# Patient Record
Sex: Male | Born: 1990 | Race: White | Hispanic: No | Marital: Single
Health system: Midwestern US, Community
[De-identification: ages and names within clinical notes are randomized; demographics above are authoritative.]

## PROBLEM LIST (undated history)

## (undated) DIAGNOSIS — K648 Other hemorrhoids: Secondary | ICD-10-CM

## (undated) DIAGNOSIS — G629 Polyneuropathy, unspecified: Secondary | ICD-10-CM

## (undated) HISTORY — DX: Other hemorrhoids: K64.8

---
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
## 2012-05-01 ENCOUNTER — Other Ambulatory Visit: Payer: Self-pay | Admitting: Family Medicine

## 2012-06-09 ENCOUNTER — Encounter (HOSPITAL_COMMUNITY): Payer: Self-pay | Admitting: Emergency Medicine

## 2012-06-09 ENCOUNTER — Emergency Department (HOSPITAL_COMMUNITY)
Admission: EM | Admit: 2012-06-09 | Discharge: 2012-06-09 | Disposition: A | Discharge status: Home / Self Care | Payer: 59 | Source: Home / Self Care | Attending: Family Medicine | Admitting: Family Medicine

## 2012-06-09 ENCOUNTER — Emergency Department (INDEPENDENT_AMBULATORY_CARE_PROVIDER_SITE_OTHER): Payer: 59

## 2012-06-09 DIAGNOSIS — S60221A Contusion of right hand, initial encounter: Secondary | ICD-10-CM

## 2012-06-09 DIAGNOSIS — S60229A Contusion of unspecified hand, initial encounter: Secondary | ICD-10-CM

## 2012-06-09 MED ORDER — TRAMADOL HCL 50 MG PO TABS: 50.0000 mg | ORAL_TABLET | Freq: Four times a day (QID) | ORAL | Status: AC | PRN

## 2012-06-09 MED ORDER — IBUPROFEN 600 MG PO TABS: 600.0000 mg | ORAL_TABLET | Freq: Three times a day (TID) | ORAL | Status: AC

## 2012-06-09 NOTE — ED Notes (Signed)
Pt "punched the ground" two days go and sustained an injury to his right hand, wrist, and knuckles. Pt states the pain has gotten worse. Ibuprofen helps, has not tried ice or heat. Pt has movement in wrist and hand, but it is somewhat limited and very sore.

## 2012-06-09 NOTE — ED Provider Notes (Signed)
History     CSN: [id number]  Arrival date & time 06/09/12  4098   First MD Initiated Contact with Patient 06/09/12 1839      Chief Complaint  Patient presents with  . Wrist Pain    (Consider location/radiation/quality/duration/timing/severity/associated sxs/prior treatment) HPI Comments: 21 y/o male otherwise healthy here c/o right hand injury sustained 2 days ago while he punched the ground. Noticed pain and swelling in hand dorsally and in palm after his injury. Also reports diffused pain in wrist and hand worse with movement and with hand grip. Swelling and bruising no improving and pain is worse as per patient report. Taking Ibuprofen for his symptoms.    History reviewed. No pertinent past medical history.  History reviewed. No pertinent past surgical history.  History reviewed. No pertinent family history.  History  Substance Use Topics  . Smoking status: Not on file  . Smokeless tobacco: Not on file  . Alcohol Use: Not on file      Review of Systems  Musculoskeletal:       As per HPI  All other systems reviewed and are negative.    Allergies  Review of patient's allergies indicates no known allergies.  Home Medications   Current Outpatient Rx  Name Route Sig Dispense Refill  . BENZOYL PEROXIDE-ERYTHROMYCIN 5-3 % EX GEL  APPLY TO AFFECTED AREA 2 TIMES DAILY 23 g 11  . IBUPROFEN 600 MG PO TABS Oral Take 1 tablet (600 mg total) by mouth 3 (three) times daily. Take with food. 20 tablet 0  . TRAMADOL HCL 50 MG PO TABS Oral Take 1 tablet (50 mg total) by mouth every 6 (six) hours as needed for pain. 20 tablet 0    BP 125/60  Pulse 66  Temp 98.7 F (37.1 C) (Oral)  Resp 14  SpO2 100%  Physical Exam  Nursing note and vitals reviewed. Constitutional: He is oriented to person, place, and time. He appears well-developed and well-nourished. No distress.  HENT:  Head: Normocephalic and atraumatic.  Cardiovascular: Normal heart sounds.   Pulmonary/Chest:  Breath sounds normal.  Musculoskeletal:       Right hand: mild to moderate more dorsal than central palmar swelling; there is associated hematoma more evident in central palm but also noticed in dorsum of hand. No skin erythema, induration, abrasions or lacerations. Diffusely tenderness to palpation over metacarpal bones  on palmar side more over 3rd, 4th and 5th metacarpal, no malalignment or deformities on palpation. Patient able to make a fist with reported discomfort. Able to adduct, abduct fingers and oppose thumb to other digits with mild discomfort.  Carpal area diffusely tender to palpation with no focal tenderness erythema or significant swelling. Limited wrist flexion, extension and lateralization due to pain. Superficial sensation appears intact. Radial and ulnar pulses patent. No distal cyanosis.  Ulnar and radial bones appear intact. With no focal tenderness, swelling or bruising.    Neurological: He is alert and oriented to person, place, and time.    ED Course  Procedures (including critical care time)  Labs Reviewed - No data to display Dg Hand Complete Right  06/09/2012  *RADIOLOGY REPORT*  Clinical Data: Punching injury.  Injury 2 days ago.  Continues to have pain.  Pain in the fifth metacarpal phalangeal joint. Bruising on the palm of the hand.  The patient reports wrist pain.  RIGHT HAND - COMPLETE 3+ VIEW  Comparison: None.  Findings: There is no evidence for acute fracture or dislocation. No soft tissue foreign  body or gas identified.  IMPRESSION: Negative exam.  Original Report Authenticated By: Patterson Hammersmith, M.D.     1. Contusion of hand, right       MDM  No obvious boxers fractures on Xray's. Impress wrist sprain with central palm contusion as a result of hard punch injury. Will probably benefit from immobilization, ICE, elevation and avoid re injury. Prescribed Ibuprofen and tramadol. Asked to follow up with hand specialist as outpatient if no improvement  or worsening symptoms.         Sharin Grave, MD 06/09/12 2257

## 2012-06-10 ENCOUNTER — Telehealth (HOSPITAL_COMMUNITY): Payer: Self-pay | Admitting: *Deleted

## 2012-06-10 NOTE — ED Notes (Signed)
Dr. Tressia Danas asked me to call pt. to come back for a R volar hand splint by the ortho tech. Tell pt. to f/u with Dr. Merlyn Lot.  Discussed with Glean Hess and she said pt. does not have to check back i- just add note to his previous chart. Front desk staff notified not to check pt. in and call me when he arrives. I called pt. and explained this to him.  He states he just wanted to make sure it was not fractured and does not see the need to f/u. I explained that Dr. Alfonse Ras said he has a significant hand contusion and recommends that he follow-up. He said he would come today for the splint. Vassie Moselle 06/10/2012\

## 2012-06-11 ENCOUNTER — Telehealth (HOSPITAL_COMMUNITY): Payer: Self-pay | Admitting: *Deleted

## 2012-06-11 NOTE — ED Notes (Signed)
I called pt. back and he said he came yesterday @ 1500 but was told he had to check back in. He did not want to have to wait 1 1/2  hrs.  I told him I had left a message with Adam at the front desk that he did not have to check in. I told him Dr. Tressia Danas wanted him to have the splint and also wanted him to have a sling, school note if needed and f/u with the hand specialist. He said he doesn't feel like it is necessary. States he does not think it is that bad. He said he does not need the school note because he is not in school now.  I asked him if he was going to follow up with Dr. Merlyn Lot and he said no he did not think it was necessary. I told him I would document this on his chart. Vassie Moselle 06/11/2012

## 2013-10-10 IMAGING — CR DG HAND COMPLETE 3+V*R*
3 series · 3 of 3 positions shown · non-contrast
Comparison: None.

CLINICAL DATA: Punching injury.  Injury 2 days ago.  Continues to
have pain.  Pain in the fifth metacarpal phalangeal joint.
Bruising on the palm of the hand.  The patient reports wrist pain.

RIGHT HAND - COMPLETE 3+ VIEW

[view not recorded (1 of 3)]
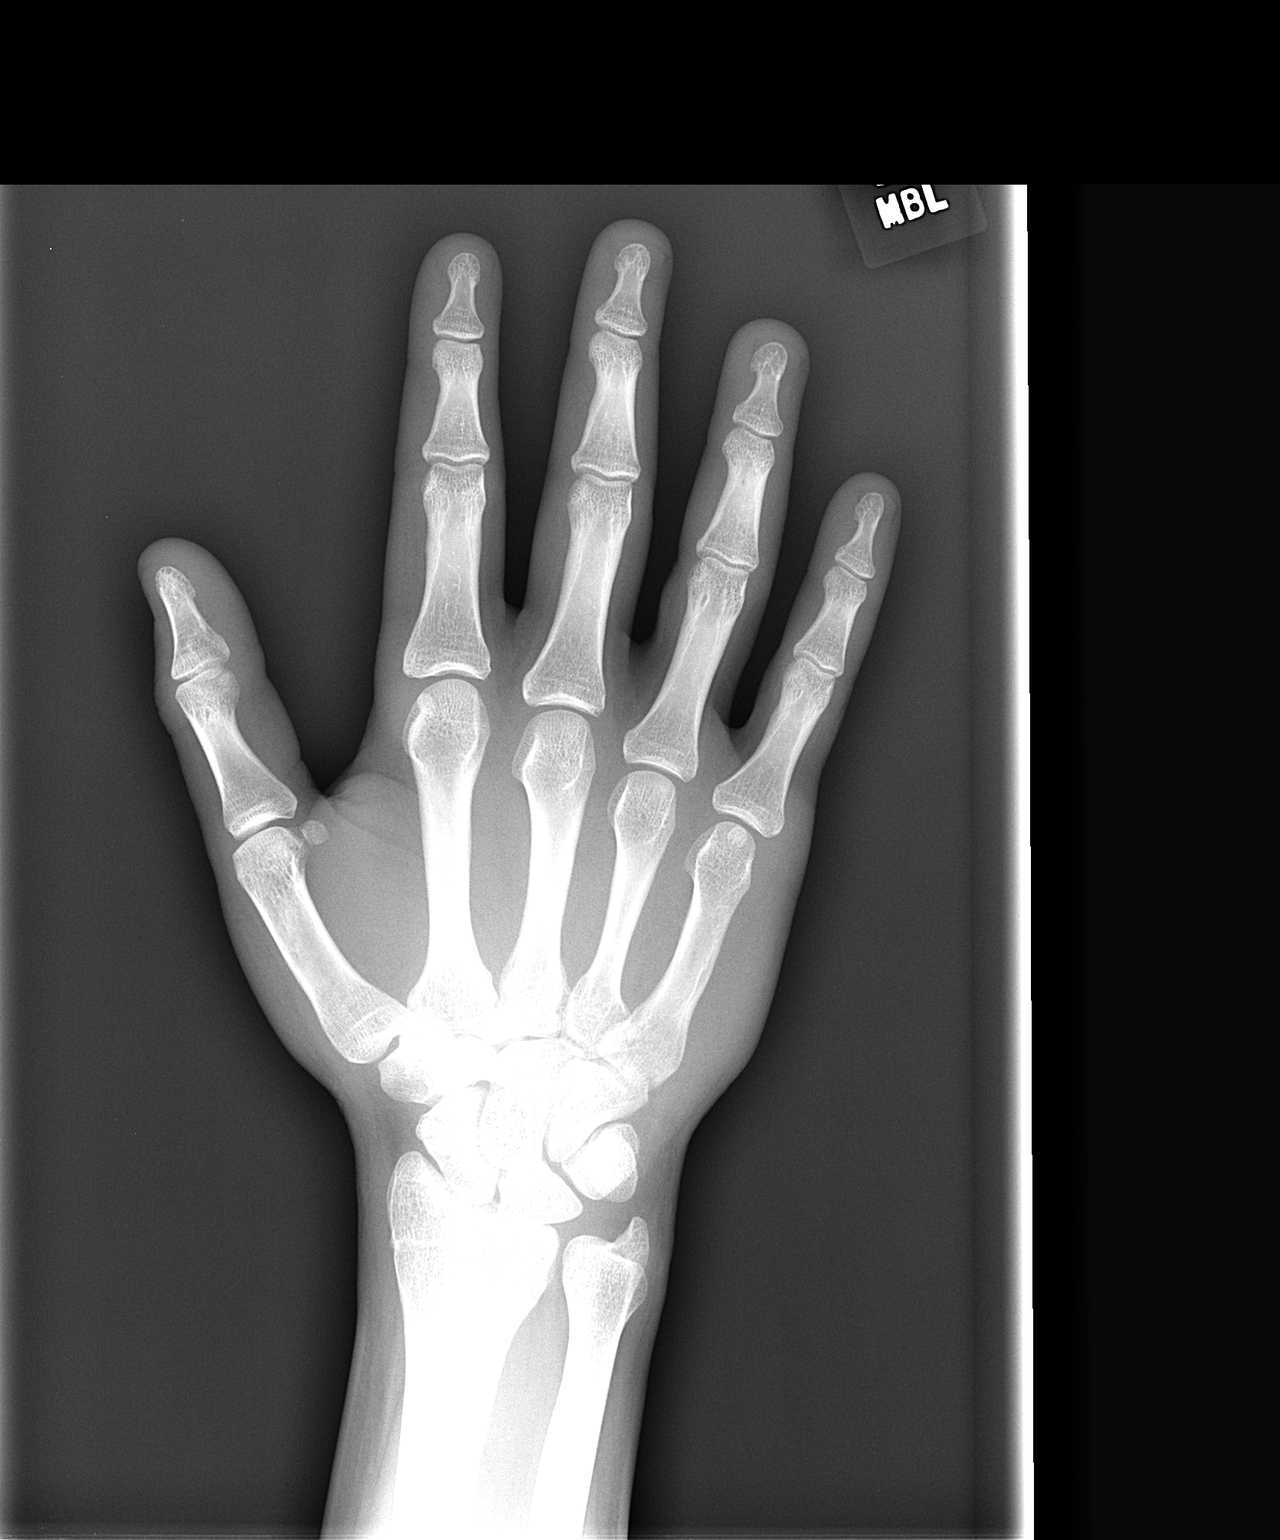

[view not recorded (2 of 3)]
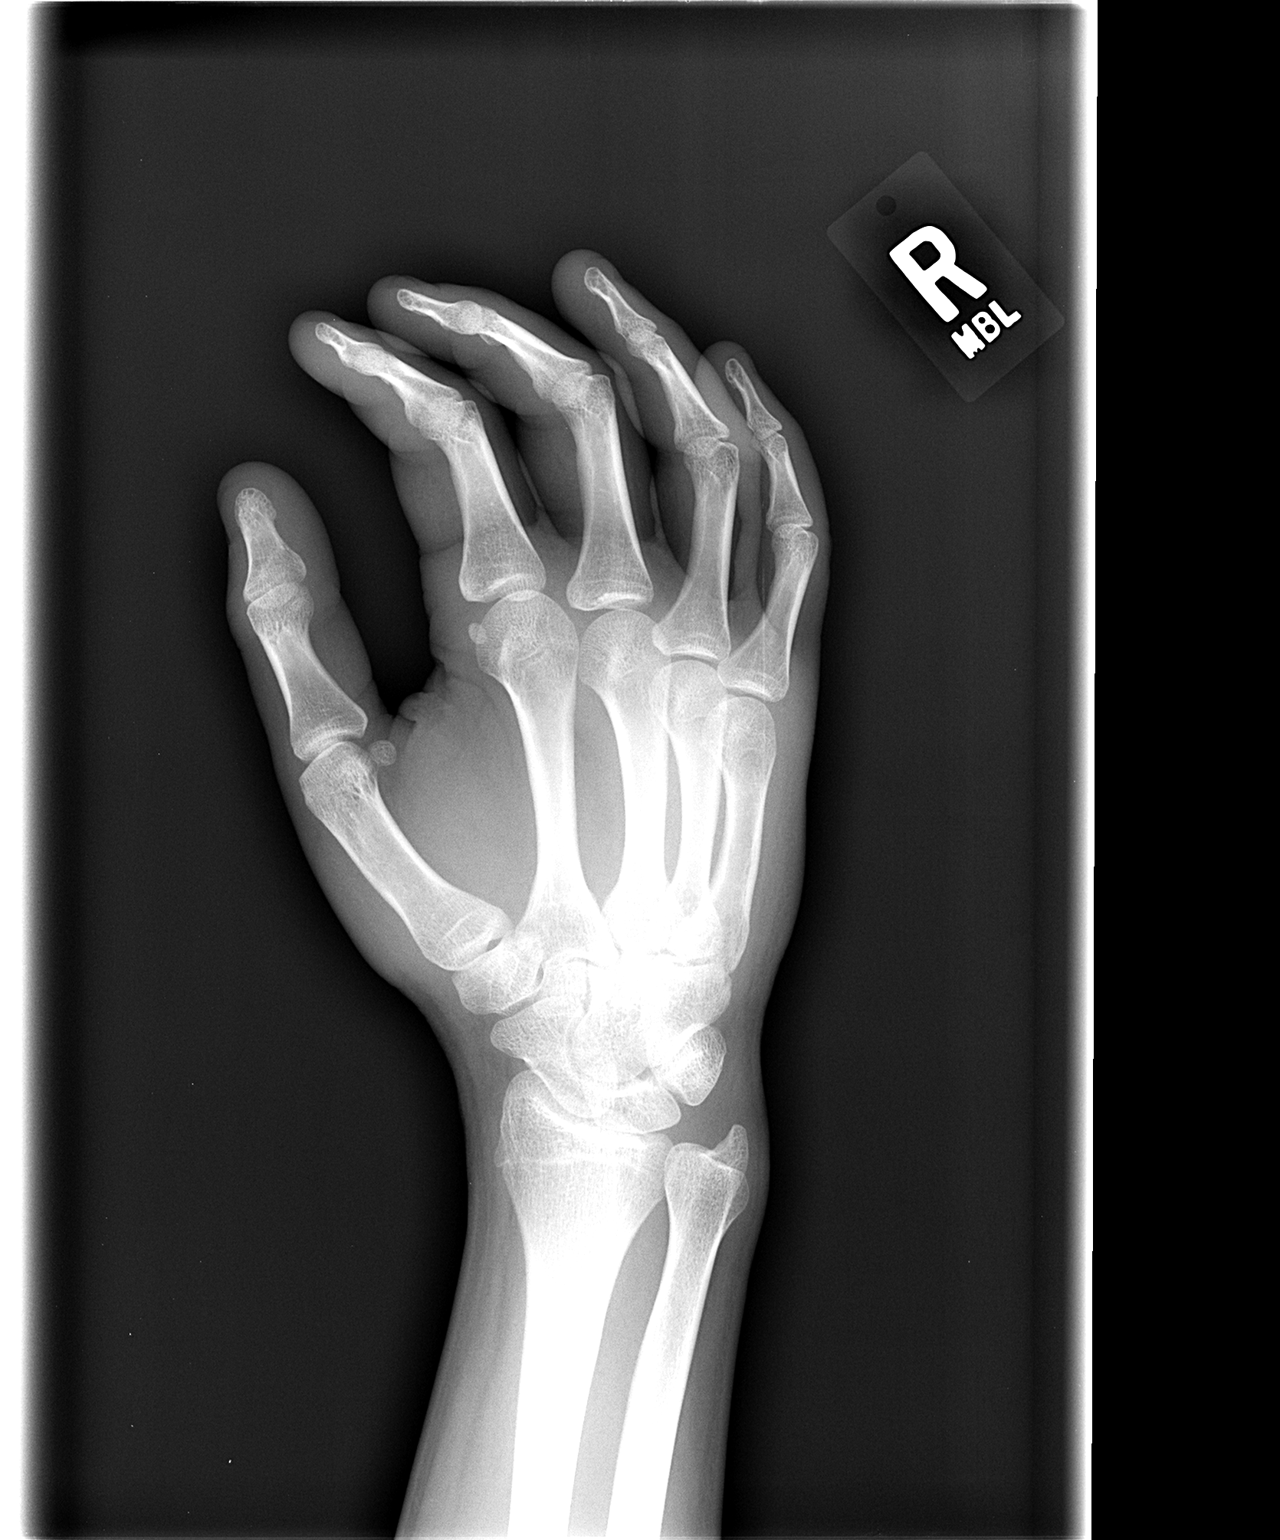

[view not recorded (3 of 3)]
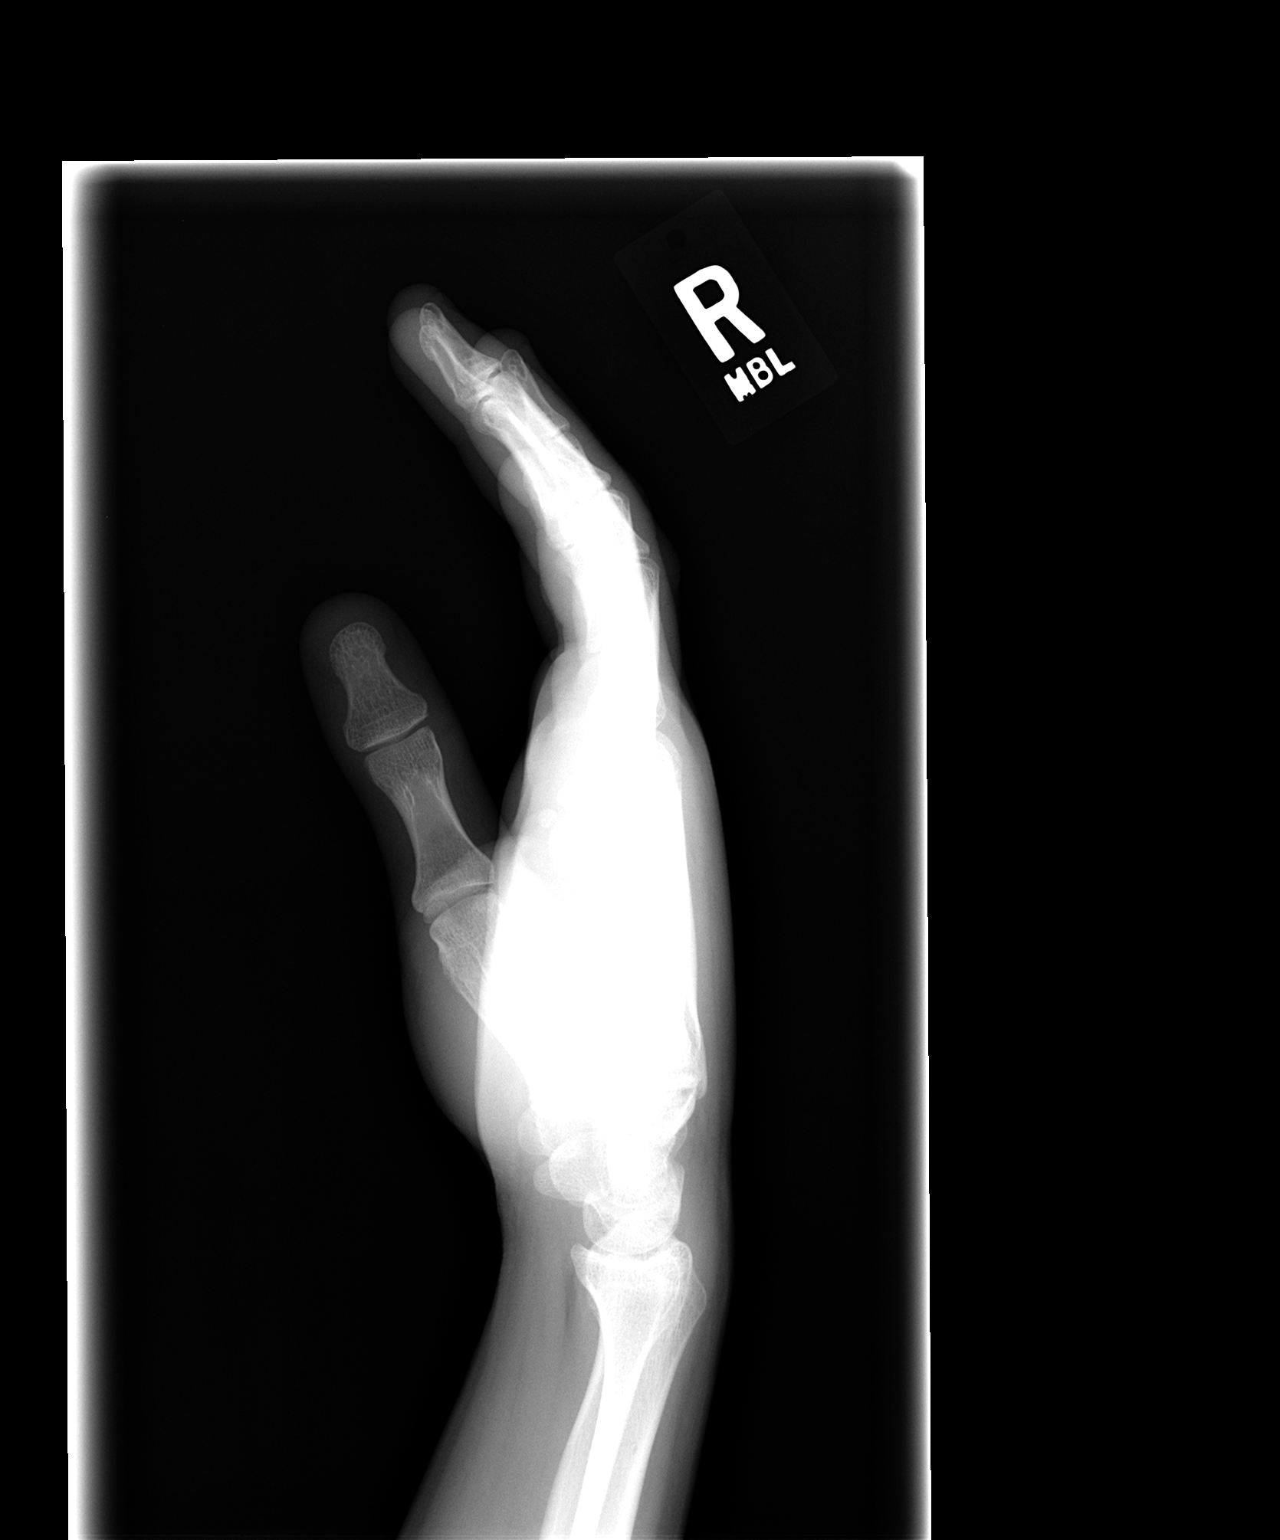

[3 of 3 positions shown; findings below may reference images not displayed]

FINDINGS: There is no evidence for acute fracture or dislocation.
No soft tissue foreign body or gas identified.
IMPRESSION: Negative exam.

## 2015-07-05 ENCOUNTER — Ambulatory Visit (INDEPENDENT_AMBULATORY_CARE_PROVIDER_SITE_OTHER): Payer: 59 | Admitting: Family Medicine

## 2015-07-05 ENCOUNTER — Encounter: Payer: Self-pay | Admitting: Family Medicine

## 2015-07-05 VITALS — BP 128/84 | HR 99 | Temp 98.4°F | Ht 71.0 in | Wt 150.6 lb

## 2015-07-05 DIAGNOSIS — H6121 Impacted cerumen, right ear: Secondary | ICD-10-CM | POA: Diagnosis not present

## 2015-07-05 DIAGNOSIS — Z0000 Encounter for general adult medical examination without abnormal findings: Secondary | ICD-10-CM

## 2015-07-05 DIAGNOSIS — R030 Elevated blood-pressure reading, without diagnosis of hypertension: Secondary | ICD-10-CM | POA: Diagnosis not present

## 2015-07-05 DIAGNOSIS — K648 Other hemorrhoids: Secondary | ICD-10-CM

## 2015-07-05 DIAGNOSIS — IMO0001 Reserved for inherently not codable concepts without codable children: Secondary | ICD-10-CM

## 2015-07-05 NOTE — Progress Notes (Signed)
Subjective:    Patient ID: Brett Munoz, male    DOB: 1991-01-23, 24 y.o.   MRN: [id number]  Brett Munoz is a 24 y.o. male presenting on 07/05/2015 for Annual Exam  Annual Physical Exam, prior to starting new job, has paperwork to be completed today.  HPI   EAR PROBLEM: - Reports new complaint within past few days after using ear plugs at work. Symptoms with intermittent Right sided fullness, muffled sound, no loss to hearing, occasional tinnitus. Previously had ear wax cleaned out when younger - Request ear evaluation, consider flushing   History of intermittent GI symptoms: - Previously followed by GI for intermittent symptoms of gas pains, bloating, constipation. No colonoscopy testing. Clinically thought to be unlikely IBS. No diagnosis made. Given recommendation of Metamucil for fiber supplement, some relief but seemed to worse gas / bloating. Has been off this for few years. Doing well. No concerns today.  Internal Hemorrhoids: - Known history of internal hemorrhoids with previous episodes of bleeding, considered surgery for this, evaluated by gen surgery, but decision to not proceed with surgery at that time. Will reconsider this in future. - Currently no active bleeding or pain  Elevated BP: No known prior dx of HTN or Pre-HTN. Today initial BP 141/82 on electronic cuff. Patient reported some anxiety at that time. He states previously his BP runs normal, but does admit to one instance when younger for a physical had an elevated BP. Never treated before. Asymptomatic. Physically active. - Repeat manual BP re-check within normal range at 128/84  BP 128/84 mmHg  Pulse 99  Temp(Src) 98.4 F (36.9 C) (Oral)  Ht 5\' 11"  (1.803 m)  Wt 150 lb 9.6 oz (68.312 kg)  BMI 21.01 kg/m2  HM: - Immunizations up to date per patient report - Confirmed last TDap 09/07/2006 on [facility], will need repeat 09/07/2016 - Not due for labs today. Consider future HIV screening as discussed  Past  Medical History  Diagnosis Date  . Internal hemorrhoids    Social History   Social History  . Marital Status: Single    Spouse Name: N/A  . Number of Children: N/A  . Years of Education: N/A   Occupational History  . Not on file.   Social History Main Topics  . Smoking status: Never Smoker   . Smokeless tobacco: Never Used  . Alcohol Use: No  . Drug Use: No  . Sexual Activity: Not Currently   Other Topics Concern  . Not on file   Social History Narrative   No family history on file. No current outpatient prescriptions on file prior to visit.   No current facility-administered medications on file prior to visit.    Review of Systems  Constitutional: Negative for fever, chills, diaphoresis, activity change, appetite change, fatigue and unexpected weight change.  HENT: Positive for tinnitus (mild on right intermittent). Negative for congestion, ear discharge, ear pain, hearing loss, postnasal drip, rhinorrhea, sinus pressure and sneezing.   Eyes: Negative for discharge, itching and visual disturbance.  Respiratory: Negative for cough, chest tightness, shortness of breath and wheezing.   Cardiovascular: Negative for chest pain and palpitations.  Gastrointestinal: Negative for nausea, vomiting, abdominal pain, diarrhea and constipation.  Genitourinary: Negative for dysuria, frequency and hematuria.  Musculoskeletal: Negative for arthralgias and neck pain.  Skin: Negative for rash.  Neurological: Negative for dizziness, weakness, light-headedness, numbness and headaches.  Hematological: Negative for adenopathy.  Psychiatric/Behavioral: Negative for behavioral problems and confusion.   Per HPI unless  specifically indicated above     Objective:    BP 128/84 mmHg  Pulse 99  Temp(Src) 98.4 F (36.9 C) (Oral)  Ht 5\' 11"  (1.803 m)  Wt 150 lb 9.6 oz (68.312 kg)  BMI 21.01 kg/m2  Wt Readings from Last 3 Encounters:  07/05/15 150 lb 9.6 oz (68.312 kg)  09/16/09 212 lb  (96.163 kg) (99 %*, Z = 2.20)   * Growth percentiles are based on CDC 2-20 Years data.    Initial BP 141/82 electronic. Repeat BP manual 128/84  Physical Exam  Constitutional: He is oriented to person, place, and time. He appears well-developed and well-nourished. No distress.  HENT:  Head: Normocephalic and atraumatic.  Right Ear: External ear normal.  Left Ear: External ear normal.  Nose: Nose normal.  Mouth/Throat: Oropharynx is clear and moist.  Right ear with impacted ear wax, following flush with mild improvement with view of TM but still significant thick earwax. No lesions within ear canal. Left ear with moderate soft earwax, significantly resolved after flushing. Left TM normal appearing.  Eyes: Conjunctivae and EOM are normal. Pupils are equal, round, and reactive to light.  Neck: Normal range of motion. Neck supple. No thyromegaly present.  Cardiovascular: Normal rate, regular rhythm, normal heart sounds and intact distal pulses.   No murmur heard. Pulmonary/Chest: Effort normal and breath sounds normal. No respiratory distress. He has no wheezes. He has no rales.  Abdominal: Soft. Bowel sounds are normal. He exhibits no distension and no mass. There is no tenderness.  Musculoskeletal: Normal range of motion. He exhibits no edema or tenderness.  Lymphadenopathy:    He has no cervical adenopathy.  Neurological: He is alert and oriented to person, place, and time.  Skin: Skin is warm and dry. No rash noted. He is not diaphoretic.  Few scattered pinpoint brownish pigemented freckle like spots on right hand only, dorsal near MCPs, not between web spaces, no erythema. Not consistent with infection or arthropods.  Psychiatric: He has a normal mood and affect. His behavior is normal.  Nursing note and vitals reviewed.  Results for orders placed or performed in visit on 03/19/09  [facility]  Result Value Ref Range   Rapid Strep negative       Assessment & Plan:    Problem List Items Addressed This Visit      Cardiovascular and Mediastinum   Internal hemorrhoids    Stable, chronic. No active symptoms. Previously followed by General Surgery, considered ligation procedure, but decided against at that time, consider future follow-up as needed        Nervous and Auditory   Excessive ear wax    R > L with increased ear wax, nearly impacted on Right. Limited view of TMs. - Proceeded with flushing of both ears. With resolved removal of wax on Left, TM appears normal. Right with more difficulty, wax seemed to be more firmly adhered only minimal removal. - Recommend OTC Debrox drops for softening and removal trial. If persistent after 1-2 weeks can return for re-evaluation        Other   Annual physical exam - Primary    23 yr male, overall healthy - Chronic condition with internal hemorrhoids, no active problem - Possible Pre-HTN with initial slightly elevated BP SBP 141, manual re-check down to SBP 128. Note patient had ears flushed and was in some discomfort at that time. - Not due for immunizations. Due next TDap 1 year, 09/07/2016. Called and LVM pt may return  for TDap and Flu shot in 1-2 months prior to leaving for [place] - RTC 1 year for next physical, monitor BP, lifestyle mods      Elevated BP    Initial elevated BP 141/82 on electronic cuff. After >15 min, re-check manual with improved 128/84. - Consider Pre-HTN range - Lifestyle modifications - Repeat BP monitoring at future follow-up         No orders of the defined types were placed in this encounter.      Follow up plan: Return in about 1 year (around 07/04/2016).  Saralyn Pilar, DO [facility], PGY-3

## 2015-07-05 NOTE — Patient Instructions (Addendum)
Dear Brett Munoz, Thank you for coming in to clinic today. It was good to meet you, best of luck in [place].  1. Overall you are very healthy. I do not have any major concerns today. - Follow-up with Surgery in future if considering the hemorrhoid treatment 2. Your ear wax on Right side is likely causing your symptoms. Both ears have significant amount. Today flushed, in the future can try the OTC Debrox ear drops if build-up again. 3. Spots on fingers (Right hand), no significant concern, continue to monitor. I do not think it is infectious. 4. Completed form today  No blood work today.   BP 141/82 mmHg  Pulse 99  Temp(Src) 98.4 F (36.9 C) (Oral)  Ht  (1.803 m)  Wt 150 lb 9.6 oz (68.312 kg)  BMI 21.01 kg/m2   Improved BP on re-check 128/84, recommend re-check in 6 to 12 months. Stay active, low salt diet. Check BP outside office.  Please schedule a follow-up appointment 1 year for next annual physical.  If you have any other questions or concerns, please feel free to call the clinic to contact me. You may also schedule an earlier appointment if necessary.  However, if your symptoms get significantly worse, please go to the Emergency Department to seek immediate medical attention.  Saralyn Pilar, DO [facility]

## 2015-07-06 ENCOUNTER — Encounter: Payer: Self-pay | Admitting: Family Medicine

## 2015-07-06 DIAGNOSIS — IMO0001 Reserved for inherently not codable concepts without codable children: Secondary | ICD-10-CM | POA: Insufficient documentation

## 2015-07-06 DIAGNOSIS — K648 Other hemorrhoids: Secondary | ICD-10-CM | POA: Insufficient documentation

## 2015-07-06 DIAGNOSIS — H6120 Impacted cerumen, unspecified ear: Secondary | ICD-10-CM | POA: Insufficient documentation

## 2015-07-06 DIAGNOSIS — I10 Essential (primary) hypertension: Secondary | ICD-10-CM | POA: Insufficient documentation

## 2015-07-06 DIAGNOSIS — R030 Elevated blood-pressure reading, without diagnosis of hypertension: Secondary | ICD-10-CM

## 2015-07-06 NOTE — Assessment & Plan Note (Signed)
Stable, chronic. No active symptoms. Previously followed by General Surgery, considered ligation procedure, but decided against at that time, consider future follow-up as needed

## 2015-07-06 NOTE — Assessment & Plan Note (Signed)
Initial elevated BP 141/82 on electronic cuff. After >15 min, re-check manual with improved 128/84. - Consider Pre-HTN range - Lifestyle modifications - Repeat BP monitoring at future follow-up

## 2015-07-06 NOTE — Assessment & Plan Note (Addendum)
23 yr male, overall healthy - Chronic condition with internal hemorrhoids, no active problem - Possible Pre-HTN with initial slightly elevated BP SBP 141, manual re-check down to SBP 128. Note patient had ears flushed and was in some discomfort at that time. - Not due for immunizations. Due next TDap 1 year, 09/07/2016. Called and LVM pt may return for TDap and Flu shot in 1-2 months prior to leaving for [place] - Completed job required paperwork given today - RTC 1 year for next physical, monitor BP, lifestyle mods

## 2015-07-06 NOTE — Assessment & Plan Note (Signed)
R > L with increased ear wax, nearly impacted on Right. Limited view of TMs. - Proceeded with flushing of both ears. With resolved removal of wax on Left, TM appears normal. Right with more difficulty, wax seemed to be more firmly adhered only minimal removal. - Recommend OTC Debrox drops for softening and removal trial. If persistent after 1-2 weeks can return for re-evaluation

## 2015-08-27 ENCOUNTER — Ambulatory Visit (INDEPENDENT_AMBULATORY_CARE_PROVIDER_SITE_OTHER): Payer: 59

## 2015-08-27 DIAGNOSIS — Z23 Encounter for immunization: Secondary | ICD-10-CM | POA: Diagnosis not present

## 2016-10-08 DIAGNOSIS — E663 Overweight: Secondary | ICD-10-CM | POA: Diagnosis not present

## 2016-10-08 DIAGNOSIS — Z6825 Body mass index (BMI) 25.0-25.9, adult: Secondary | ICD-10-CM | POA: Diagnosis not present

## 2016-10-08 DIAGNOSIS — R030 Elevated blood-pressure reading, without diagnosis of hypertension: Secondary | ICD-10-CM | POA: Diagnosis not present

## 2016-10-08 DIAGNOSIS — Z113 Encounter for screening for infections with a predominantly sexual mode of transmission: Secondary | ICD-10-CM | POA: Diagnosis not present

## 2017-03-14 DIAGNOSIS — S63521A Sprain of radiocarpal joint of right wrist, initial encounter: Secondary | ICD-10-CM | POA: Diagnosis not present

## 2020-11-29 DIAGNOSIS — F10220 Alcohol dependence with intoxication, uncomplicated: Secondary | ICD-10-CM

## 2020-11-29 NOTE — ED Notes (Signed)
Pt states he is here for detox. 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 Last drink today around 5 pm. States also had some white claw today. States smokes weed several times a week.

## 2020-11-29 NOTE — ED Provider Notes (Signed)
This is a 30 year old male with a history of anxiety and depression and alcohol dependence.  He has been drinking heavily since he was 30 years old.  He last went through detox in September 2020.  He said within 2 months he was drinking heavily again and has been drinking heavily since that time until now.  He averages about seventeen shots of vodka daily.  He smokes occasional weed twice a week.  He denies any other illegal substances.  He is supposed to be taking metoprolol for his hypertension but he has never gotten that filled and that has been over 2 years.  He is not on any other medications.  He does smoke.  He denies any chest pain or shortness of breath.  He is quite anxious.  He states that he was trying to get to detox facility today but his friend never came over to pick him up.  She arrives here tonight again looking for detox.  His last drink was approximately an hour before he arrived here.  He had about ten shots of vodka so far today.  Patient has had no nausea or vomiting or other acute symptomatology.           History reviewed. No pertinent past medical history.    History reviewed. No pertinent surgical history.      History reviewed. No pertinent family history.    Social History     Socioeconomic History   ??? Marital status: Not on file     Spouse name: Not on file   ??? Number of children: Not on file   ??? Years of education: Not on file   ??? Highest education level: Not on file   Occupational History   ??? Not on file   Tobacco Use   ??? Smoking status: Not on file   ??? Smokeless tobacco: Not on file   Substance and Sexual Activity   ??? Alcohol use: Not on file   ??? Drug use: Not on file   ??? Sexual activity: Not on file   Other Topics Concern   ??? Not on file   Social History Narrative   ??? Not on file     Social Determinants of Health     Financial Resource Strain:    ??? Difficulty of Paying Living Expenses: Not on file   Food Insecurity:    ??? Worried About Running Out of Food in the Last Year: Not on  file   ??? Ran Out of Food in the Last Year: Not on file   Transportation Needs:    ??? Lack of Transportation (Medical): Not on file   ??? Lack of Transportation (Non-Medical): Not on file   Physical Activity:    ??? Days of Exercise per Week: Not on file   ??? Minutes of Exercise per Session: Not on file   Stress:    ??? Feeling of Stress : Not on file   Social Connections:    ??? Frequency of Communication with Friends and Family: Not on file   ??? Frequency of Social Gatherings with Friends and Family: Not on file   ??? Attends Religious Services: Not on file   ??? Active Member of Clubs or Organizations: Not on file   ??? Attends Banker Meetings: Not on file   ??? Marital Status: Not on file   Intimate Partner Violence:    ??? Fear of Current or Ex-Partner: Not on file   ??? Emotionally Abused: Not on file   ???  Physically Abused: Not on file   ??? Sexually Abused: Not on file   Housing Stability:    ??? Unable to Pay for Housing in the Last Year: Not on file   ??? Number of Places Lived in the Last Year: Not on file   ??? Unstable Housing in the Last Year: Not on file         ALLERGIES: Patient has no known allergies.    Review of Systems   Constitutional: Negative for activity change, appetite change, chills, fatigue and fever.   HENT: Negative for ear pain, facial swelling, sore throat and trouble swallowing.    Eyes: Negative for pain, discharge and visual disturbance.   Respiratory: Positive for shortness of breath. Negative for chest tightness and wheezing.    Cardiovascular: Positive for palpitations. Negative for chest pain.   Gastrointestinal: Negative for abdominal pain, blood in stool, nausea and vomiting.   Genitourinary: Negative for difficulty urinating, flank pain and hematuria.   Musculoskeletal: Negative for arthralgias, joint swelling, myalgias and neck pain.   Skin: Negative for color change and rash.   Neurological: Negative for dizziness, weakness, numbness and headaches.   Hematological: Negative for adenopathy.  Does not bruise/bleed easily.   Psychiatric/Behavioral: Positive for agitation. Negative for behavioral problems, confusion and sleep disturbance.   All other systems reviewed and are negative.      Vitals:    11/29/20 2039   Pulse: (!) 178   Resp: 18   Temp: 98 ??F (36.7 ??C)   SpO2: 96%            Physical Exam  Vitals and nursing note reviewed.   Constitutional:       General: He is not in acute distress.     Appearance: He is well-developed.      Comments: This is a 30 year old male who presents with rapid heartbeat and symptoms he says on withdrawal.  He has had no seizures.  He last drank about an hour before arrival.  Vital signs are as noted.   HENT:      Head: Normocephalic and atraumatic.      Nose: Nose normal.   Eyes:      General: No scleral icterus.     Conjunctiva/sclera: Conjunctivae normal.      Pupils: Pupils are equal, round, and reactive to light.   Neck:      Thyroid: No thyromegaly.      Vascular: No JVD.      Trachea: No tracheal deviation.      Comments: No carotid bruits noted.  Cardiovascular:      Rate and Rhythm: Regular rhythm. Tachycardia present.      Heart sounds: Normal heart sounds. No murmur heard.  No friction rub. No gallop.       Comments: Patient's heart rate is ranging from 153-172  Pulmonary:      Effort: Pulmonary effort is normal. No respiratory distress.      Breath sounds: Normal breath sounds. No stridor. No wheezing or rales.   Chest:      Chest wall: No tenderness.   Abdominal:      General: Bowel sounds are normal. There is no distension.      Palpations: Abdomen is soft. There is no mass.      Tenderness: There is no abdominal tenderness. There is no guarding or rebound.   Musculoskeletal:         General: No tenderness. Normal range of motion.      Cervical  back: Normal range of motion and neck supple.   Lymphadenopathy:      Cervical: No cervical adenopathy.   Skin:     General: Skin is warm and dry.      Findings: No erythema or rash.   Neurological:      Mental  Status: He is alert and oriented to person, place, and time.      Cranial Nerves: No cranial nerve deficit.      Coordination: Coordination normal.      Deep Tendon Reflexes: Reflexes are normal and symmetric.   Psychiatric:         Behavior: Behavior normal.         Thought Content: Thought content normal.         Judgment: Judgment normal.          MDM  Number of Diagnoses or Management Options  Alcohol dependence with uncomplicated intoxication (HCC): established and worsening     Amount and/or Complexity of Data Reviewed  Clinical lab tests: ordered and reviewed  Decide to obtain previous medical records or to obtain history from someone other than the patient: yes  Review and summarize past medical records: yes  Discuss the patient with other providers: yes    Risk of Complications, Morbidity, and/or Mortality  Presenting problems: high  Diagnostic procedures: high  Management options: high    Patient Progress  Patient progress: stable         Procedures  ED MD EKG interpretation: There is a sinus tachycardia at 146 beats a minute.  Axis is normal and intervals are normal.  There is no ectopy noted.  No acute ischemic changes are noted.Lovey Newcomer, MD    11:15 PM the patient is just getting his goody bag at this point.  I have ordered some metoprolol IV given this patient's pressure and his tachycardia.  His heart rate has come down into the 140 range.  He is ambulating to the bathroom without difficulty.  His blood alcohol level is noted to be 341.  No other significant abnormalities are noted on his lab.  Urine drug screen is pending.  Does not appear this patient is in withdrawal.  We will give him goody bag and likely discharge the patient home to follow-up with detox resources as an outpatient.    Patient is signed out to Dr. Dawna Part at change of shift. He is to be discharged after fluids and medications and will be given resources for follow up.

## 2020-11-29 NOTE — ED Notes (Signed)
Pt c/o alcohol withdrawal.  Pt reports last drink was approx 1 hour prior to arrival.  Pt arrives tachycardic and hypertensive.

## 2020-11-30 ENCOUNTER — Inpatient Hospital Stay
Admit: 2020-11-30 | Discharge: 2020-11-30 | Disposition: A | Discharge status: Home / Self Care | Payer: BLUE CROSS/BLUE SHIELD | Attending: Emergency Medicine

## 2020-11-30 LAB — SALICYLATE
Salicylate level: <1.7 MG/DL — ABNORMAL LOW (ref 2.8–20.0)
Salicylate: <1.7 MG/DL — ABNORMAL LOW (ref 2.8–20.0)

## 2020-11-30 LAB — URINALYSIS W/ RFLX MICROSCOPIC
BACTERIA, URINE: NEGATIVE /hpf
Bacteria: NEGATIVE /hpf
Bilirubin, Urine: NEGATIVE
Bilirubin: NEGATIVE
Blood, Urine: NEGATIVE
Blood: NEGATIVE
Glucose, Ur: NEGATIVE mg/dL
Glucose: NEGATIVE mg/dL
Ketone: 15 mg/dL — AB
Ketones, Urine: 15 mg/dL — AB
Leukocyte Esterase, Urine: NEGATIVE
Leukocyte Esterase: NEGATIVE
Nitrite, Urine: NEGATIVE
Nitrites: NEGATIVE
Protein, UA: 100 mg/dL — AB
Protein: 100 mg/dL — AB
Specific Gravity, UA: 1.015 (ref 1.003–1.030)
Specific gravity: 1.015 (ref 1.003–1.030)
Urobilinogen, UA, POCT: 0.2 EU/dL (ref 0.2–1.0)
Urobilinogen: 0.2 EU/dL (ref 0.2–1.0)
pH (UA): 6 (ref 5.0–8.0)
pH, UA: 6 (ref 5.0–8.0)

## 2020-11-30 LAB — COMPREHENSIVE METABOLIC PANEL
ALT: 94 U/L — ABNORMAL HIGH (ref 12–78)
AST: 135 U/L — ABNORMAL HIGH (ref 15–37)
Albumin/Globulin Ratio: 1 — ABNORMAL LOW (ref 1.1–2.2)
Albumin: 4.2 g/dL (ref 3.5–5.0)
Alkaline Phosphatase: 90 U/L (ref 45–117)
Anion Gap: 12 mmol/L (ref 5–15)
BUN: 6 MG/DL (ref 6–20)
Bun/Cre Ratio: 5 — ABNORMAL LOW (ref 12–20)
CO2: 25 mmol/L (ref 21–32)
Calcium: 9.4 MG/DL (ref 8.5–10.1)
Chloride: 99 mmol/L (ref 97–108)
Creatinine: 1.13 MG/DL (ref 0.70–1.30)
EGFR IF NonAfrican American: >60 mL/min/{1.73_m2} (ref >60)
GFR African American: >60 mL/min/{1.73_m2} (ref >60)
Globulin: 4.1 g/dL — ABNORMAL HIGH (ref 2.0–4.0)
Glucose: 120 mg/dL — ABNORMAL HIGH (ref 65–100)
Potassium: 3.3 mmol/L — ABNORMAL LOW (ref 3.5–5.1)
Sodium: 136 mmol/L (ref 136–145)
Total Bilirubin: 0.7 MG/DL (ref 0.2–1.0)
Total Protein: 8.3 g/dL — ABNORMAL HIGH (ref 6.4–8.2)

## 2020-11-30 LAB — CBC WITH AUTO DIFFERENTIAL
Basophils %: 2 % — ABNORMAL HIGH (ref 0–1)
Basophils Absolute: 0.2 10*3/uL — ABNORMAL HIGH (ref 0.0–0.1)
Eosinophils %: 0 % (ref 0–7)
Eosinophils Absolute: 0 10*3/uL (ref 0.0–0.4)
Granulocyte Absolute Count: 0 10*3/uL (ref 0.00–0.04)
Hematocrit: 50.1 % (ref 36.6–50.3)
Hemoglobin: 16.6 g/dL (ref 12.1–17.0)
Immature Granulocytes: 0 % (ref 0.0–0.5)
Lymphocytes %: 30 % (ref 12–49)
Lymphocytes Absolute: 2.6 10*3/uL (ref 0.8–3.5)
MCH: 30.5 PG (ref 26.0–34.0)
MCHC: 33.1 g/dL (ref 30.0–36.5)
MCV: 91.9 FL (ref 80.0–99.0)
MPV: 9.6 FL (ref 8.9–12.9)
Monocytes %: 14 % — ABNORMAL HIGH (ref 5–13)
Monocytes Absolute: 1.2 10*3/uL — ABNORMAL HIGH (ref 0.0–1.0)
NRBC Absolute: 0 10*3/uL (ref 0.00–0.01)
Neutrophils %: 54 % (ref 32–75)
Neutrophils Absolute: 4.6 10*3/uL (ref 1.8–8.0)
Nucleated RBCs: 0 PER 100 WBC
Platelets: 350 10*3/uL (ref 150–400)
RBC: 5.45 M/uL (ref 4.10–5.70)
RDW: 13.5 % (ref 11.5–14.5)
WBC: 8.6 10*3/uL (ref 4.1–11.1)

## 2020-11-30 LAB — EKG 12-LEAD
Atrial Rate: 156 {beats}/min
Atrial Rate: 146 {beats}/min
P Axis: 62 degrees
P Axis: 78 degrees
P-R Interval: 122 ms
P-R Interval: 120 ms
Q-T Interval: 254 ms
Q-T Interval: 272 ms
QRS Duration: 84 ms
QRS Duration: 88 ms
QTc Calculation (Bazett): 409 ms
QTc Calculation (Bazett): 423 ms
R Axis: 110 degrees
R Axis: 86 degrees
T Axis: 37 degrees
T Axis: 52 degrees
Ventricular Rate: 156 {beats}/min
Ventricular Rate: 146 {beats}/min

## 2020-11-30 LAB — DRUG SCREEN, URINE
AMPHETAMINES: NEGATIVE
Amphetamine Screen, Urine: NEGATIVE
BARBITURATES: NEGATIVE
BENZODIAZEPINES: NEGATIVE
Barbiturate Screen, Urine: NEGATIVE
Benzodiazepine Screen, Urine: NEGATIVE
COCAINE: NEGATIVE
Cocaine Screen Urine: NEGATIVE
METHADONE: NEGATIVE
Methadone Screen, Urine: NEGATIVE
OPIATES: NEGATIVE
Opiate Screen, Urine: NEGATIVE
PCP Screen, Urine: NEGATIVE
PCP(PHENCYCLIDINE): NEGATIVE
THC (TH-CANNABINOL): NEGATIVE
THC Screen, Urine: NEGATIVE

## 2020-11-30 LAB — ACETAMINOPHEN LEVEL: Acetaminophen Level: <2 ug/mL — ABNORMAL LOW (ref 10–30)

## 2020-11-30 LAB — MAGNESIUM
Magnesium: 2.4 mg/dL (ref 1.6–2.4)
Magnesium: 2.4 mg/dL (ref 1.6–2.4)

## 2020-11-30 LAB — ETHYL ALCOHOL
ALCOHOL(ETHYL),SERUM: 361 MG/DL — ABNORMAL HIGH (ref <10)
Ethyl Alcohol: 361 MG/DL — ABNORMAL HIGH (ref <10)

## 2020-11-30 LAB — METABOLIC PANEL, COMPREHENSIVE
A-G Ratio: 1 — ABNORMAL LOW (ref 1.1–2.2)
ALT (SGPT): 94 U/L — ABNORMAL HIGH (ref 12–78)
AST (SGOT): 135 U/L — ABNORMAL HIGH (ref 15–37)
Albumin: 4.2 g/dL (ref 3.5–5.0)
Alk. phosphatase: 90 U/L (ref 45–117)
Anion gap: 12 mmol/L (ref 5–15)
BUN/Creatinine ratio: 5 — ABNORMAL LOW (ref 12–20)
BUN: 6 MG/DL (ref 6–20)
Bilirubin, total: 0.7 MG/DL (ref 0.2–1.0)
CO2: 25 mmol/L (ref 21–32)
Calcium: 9.4 MG/DL (ref 8.5–10.1)
Chloride: 99 mmol/L (ref 97–108)
Creatinine: 1.13 MG/DL (ref 0.70–1.30)
GFR est AA: >60 mL/min/{1.73_m2} (ref >60)
GFR est non-AA: >60 mL/min/{1.73_m2} (ref >60)
Globulin: 4.1 g/dL — ABNORMAL HIGH (ref 2.0–4.0)
Glucose: 120 mg/dL — ABNORMAL HIGH (ref 65–100)
Potassium: 3.3 mmol/L — ABNORMAL LOW (ref 3.5–5.1)
Protein, total: 8.3 g/dL — ABNORMAL HIGH (ref 6.4–8.2)
Sodium: 136 mmol/L (ref 136–145)

## 2020-11-30 LAB — CBC WITH AUTOMATED DIFF
ABS. BASOPHILS: 0.2 10*3/uL — ABNORMAL HIGH (ref 0.0–0.1)
ABS. EOSINOPHILS: 0 10*3/uL (ref 0.0–0.4)
ABS. IMM. GRANS.: 0 10*3/uL (ref 0.00–0.04)
ABS. LYMPHOCYTES: 2.6 10*3/uL (ref 0.8–3.5)
ABS. MONOCYTES: 1.2 10*3/uL — ABNORMAL HIGH (ref 0.0–1.0)
ABS. NEUTROPHILS: 4.6 10*3/uL (ref 1.8–8.0)
ABSOLUTE NRBC: 0 10*3/uL (ref 0.00–0.01)
BASOPHILS: 2 % — ABNORMAL HIGH (ref 0–1)
EOSINOPHILS: 0 % (ref 0–7)
HCT: 50.1 % (ref 36.6–50.3)
HGB: 16.6 g/dL (ref 12.1–17.0)
IMMATURE GRANULOCYTES: 0 % (ref 0.0–0.5)
LYMPHOCYTES: 30 % (ref 12–49)
MCH: 30.5 PG (ref 26.0–34.0)
MCHC: 33.1 g/dL (ref 30.0–36.5)
MCV: 91.9 FL (ref 80.0–99.0)
MONOCYTES: 14 % — ABNORMAL HIGH (ref 5–13)
MPV: 9.6 FL (ref 8.9–12.9)
NEUTROPHILS: 54 % (ref 32–75)
NRBC: 0 PER 100 WBC
PLATELET: 350 10*3/uL (ref 150–400)
RBC: 5.45 M/uL (ref 4.10–5.70)
RDW: 13.5 % (ref 11.5–14.5)
WBC: 8.6 10*3/uL (ref 4.1–11.1)

## 2020-11-30 LAB — EKG, 12 LEAD, INITIAL
Atrial Rate: 156 {beats}/min
Atrial Rate: 146 {beats}/min
Calculated P Axis: 62 degrees
Calculated P Axis: 78 degrees
Calculated R Axis: 110 degrees
Calculated R Axis: 86 degrees
Calculated T Axis: 37 degrees
Calculated T Axis: 52 degrees
P-R Interval: 122 ms
P-R Interval: 120 ms
Q-T Interval: 254 ms
Q-T Interval: 272 ms
QRS Duration: 84 ms
QRS Duration: 88 ms
QTC Calculation (Bezet): 409 ms
QTC Calculation (Bezet): 423 ms
Ventricular Rate: 156 {beats}/min
Ventricular Rate: 146 {beats}/min

## 2020-11-30 LAB — SAMPLES BEING HELD

## 2020-11-30 LAB — ACETAMINOPHEN: Acetaminophen level: <2 ug/mL — ABNORMAL LOW (ref 10–30)

## 2020-11-30 MED ORDER — FOLIC ACID 5 MG/ML IJ SOLN
5 mg/mL | Freq: Once | INTRAMUSCULAR | Status: AC
Start: 2020-11-30 — End: 2020-11-30
  Administered 2020-11-30: 05:00:00 via INTRAVENOUS

## 2020-11-30 MED ORDER — METOPROLOL TARTRATE 50 MG TAB
50 mg | Freq: Two times a day (BID) | ORAL | Status: DC
Start: 2020-11-30 — End: 2020-11-30
  Administered 2020-11-30: 11:00:00 via ORAL

## 2020-11-30 MED ORDER — CHLORDIAZEPOXIDE 25 MG CAP
25 mg | ORAL | Status: AC | PRN
Start: 2020-11-30 — End: 2020-11-30
  Administered 2020-11-30: 11:00:00 via ORAL

## 2020-11-30 MED ORDER — CHLORDIAZEPOXIDE 25 MG CAP
25 mg | Freq: Once | ORAL | Status: AC
Start: 2020-11-30 — End: 2020-11-30
  Administered 2020-11-30: 07:00:00 via ORAL

## 2020-11-30 MED ORDER — METOPROLOL TARTRATE 5 MG/5 ML IV SOLN
5 mg/ mL | INTRAVENOUS | Status: AC
Start: 2020-11-30 — End: 2020-11-29
  Administered 2020-11-30: 05:00:00 via INTRAVENOUS

## 2020-11-30 MED ORDER — LORAZEPAM 2 MG/ML IJ SOLN
2 mg/mL | INTRAMUSCULAR | Status: AC
Start: 2020-11-30 — End: 2020-11-29
  Administered 2020-11-30: 04:00:00 via INTRAVENOUS

## 2020-11-30 MED ORDER — CHLORDIAZEPOXIDE 25 MG CAP
25 mg | ORAL_CAPSULE | ORAL | 0 refills | Status: DC
Start: 2020-11-30 — End: 2021-04-07

## 2020-11-30 MED FILL — METOPROLOL TARTRATE 5 MG/5 ML IV SOLN: 5 mg/ mL | INTRAVENOUS | Qty: 5

## 2020-11-30 MED FILL — CHLORDIAZEPOXIDE 25 MG CAP: 25 mg | ORAL | Qty: 4

## 2020-11-30 MED FILL — LORAZEPAM 2 MG/ML IJ SOLN: 2 mg/mL | INTRAMUSCULAR | Qty: 1

## 2020-11-30 MED FILL — SODIUM CHLORIDE 0.9 % IV: INTRAVENOUS | Qty: 1000

## 2020-11-30 MED FILL — METOPROLOL TARTRATE 50 MG TAB: 50 mg | ORAL | Qty: 1

## 2020-11-30 NOTE — ED Notes (Signed)
Pt discharged with paperwork. Pt had no questions about discharge plan and was alert & oriented in no distress. Pt given education about his condition and his prescriptions. Pt indicated understanding and verbalized that he would not drive while taking his librium. Pt stated he would have someone drive him to the pharmacy this morning to pick up his medication.

## 2020-11-30 NOTE — ED Notes (Signed)
Patient is awake and alert and feeling much better overall.  Still slightly anxious but overall better.  He states he should be taking 50 mg of metoprolol daily but his doctor told him to take 100 mg if his blood pressure was up.  He states he is afraid to take that but knows he needs to.  His heart rate had come down nicely has back up to about 115 when I walk in the room.  Getting given a p.o. dose of metoprolol here.  He is due for another Librium dose at 6 AM so I ordered that as well.  Patient is comfortable going home and following up in rehab tomorrow.  I will send him home with Librium to get him through the next 24 hours.

## 2020-11-30 NOTE — ED Notes (Signed)
Care of patient at change of shift.  He received Ativan and fluids as well as metoprolol for his blood pressure and heart rate (he is normally on metoprolol not taking it) overall his vitals have improved however he does still feel shaky and anxious at this time.  He has never had DTs or seizures.  He does have rehab scheduled for Wednesday, tomorrow.  We will try p.o. Librium in anticipation of discharge.

## 2021-03-24 ENCOUNTER — Encounter: Attending: Emergency Medicine | Primary: Physician Assistant

## 2021-04-07 ENCOUNTER — Ambulatory Visit: Admit: 2021-04-07 | Attending: Physician Assistant | Primary: Physician Assistant

## 2021-04-07 ENCOUNTER — Ambulatory Visit: Attending: Physician Assistant | Primary: Physician Assistant

## 2021-04-07 DIAGNOSIS — I10 Essential (primary) hypertension: Secondary | ICD-10-CM

## 2021-04-07 NOTE — Progress Notes (Signed)
Hunter Lin is a 30 y.o. male and presents with   Chief Complaint   Patient presents with   ??? Establish Care       Here to establish care with bottles of meds  Request B-12 injections for neuropathy   Was seen at [facility] recently for pins and needles of both legs given Gabapentin  no weakness or falls     Hx/o Alcohol abuse - treated at numerous centers in the past, including [facility] and [facility]   Most recent inpatient treatment: Jan - Mar 2022 at [facility]   Has upcoming appt at [facility] on 04/12/2021  Last drink 11/29/2020    HTN -- taking metoprolol 50 mg as dir  Denies HA, dizziness and vision changes     Anxiety an depression -- upcoming appt for mental health therapy   Denies SI/HI    Denies cp, sob or dyspnea. No NVDC or Urinary sx    Current Outpatient Medications   Medication Sig Dispense Refill   ??? dextroamphetamine-amphetamine (ADDERALL) 5 mg tablet Take 5 mg by mouth.     ??? gabapentin (NEURONTIN) 100 mg capsule Take  by mouth three (3) times daily.     ??? metoprolol succinate 50 mg CSpX Take  by mouth.       No Known Allergies  Past Medical History:   Diagnosis Date   ??? ADHD     Adderall 5mg  daily    ??? Alcohol abuse 2012   ??? Anxiety and depression    ??? COVID 03/2021   ??? Hypertension     metoprolol 50mg     ??? Neuropathy     gabapentin 100mg  TID     History reviewed. No pertinent surgical history.  Family History   Problem Relation Age of Onset   ??? Ovarian Cancer Mother    ??? Colon Cancer Father    ??? Hypertension Father    ??? Depression Father    ??? Colon Cancer Maternal Grandfather    ??? COPD Paternal Grandmother    ??? Heart Attack Paternal Grandfather      Social History     Tobacco Use   ??? Smoking status: Never Smoker   ??? Smokeless tobacco: Never Used   Substance Use Topics   ??? Alcohol use: Not Currently     Comment: Sober since 11/29/2020        ROS: as per HPI otherwise NEG    Objective:  Visit Vitals  BP 110/60   Temp 98.4 ??F (36.9 ??C)   Ht 5\' 10"  (1.778 m)   Wt 218 lb (98.9 kg)   BMI 31.28 kg/m??       In  NAD. Alert.  Heart -- RRR.  Lungs -- CTA.  Abdomen -- Benign   Extremities -- No edema       No results found for this or any previous visit (from the past 24 hour(s)).    Assessment/Plan:    ICD-10-CM ICD-9-CM    1. Hypertension, unspecified type  I10 401.9 COLLECTION VENOUS BLOOD,VENIPUNCTURE      CBC WITH AUTOMATED DIFF      METABOLIC PANEL, COMPREHENSIVE      VITAMIN B12      UA/M W/RFLX CULTURE, COMP      HEPATITIS C AB, RFLX TO QT BY PCR   2. Neuropathy  G62.9 355.9 COLLECTION VENOUS BLOOD,VENIPUNCTURE      CBC WITH AUTOMATED DIFF      METABOLIC PANEL, COMPREHENSIVE      VITAMIN B12  UA/M W/RFLX CULTURE, COMP      HEPATITIS C AB, RFLX TO QT BY PCR   3. Anxiety and depression  F41.9 300.00 HEPATITIS C AB, RFLX TO QT BY PCR    F32.A 311    4. Alcohol abuse  F10.10 305.00 HEPATITIS C AB, RFLX TO QT BY PCR   5. Attention deficit hyperactivity disorder (ADHD), unspecified ADHD type  F90.9 314.01          Non-fasting labs sent including B12  Advised to send prior labs and records  F/u with mental health therapist as scheduled   cpe next visit             Author:  Jonel Weldon A Austen Wygant-Edwards, PA 04/07/2021 10:30 AM

## 2021-04-08 LAB — COMPREHENSIVE METABOLIC PANEL
ALT: 54 IU/L — ABNORMAL HIGH (ref 0–44)
AST: 18 IU/L (ref 0–40)
Albumin/Globulin Ratio: 1.2 NA (ref 1.2–2.2)
Albumin: 4 g/dL — ABNORMAL LOW (ref 4.1–5.2)
Alkaline Phosphatase: 112 IU/L (ref 44–121)
BUN: 14 mg/dL (ref 6–20)
Bun/Cre Ratio: 15 NA (ref 9–20)
CO2: 26 mmol/L (ref 20–29)
Calcium: 9.7 mg/dL (ref 8.7–10.2)
Chloride: 103 mmol/L (ref 96–106)
Creatinine: 0.95 mg/dL (ref 0.76–1.27)
Est, Glomerular Filtration Rate: 113 mL/min/{1.73_m2} (ref >59)
Globulin, Total: 3.4 g/dL (ref 1.5–4.5)
Glucose: 101 mg/dL — ABNORMAL HIGH (ref 65–99)
Potassium: 5.2 mmol/L (ref 3.5–5.2)
Sodium: 142 mmol/L (ref 134–144)
Total Bilirubin: <0.2 mg/dL (ref 0.0–1.2)
Total Protein: 7.4 g/dL (ref 6.0–8.5)

## 2021-04-08 LAB — UA/M W/RFLX CULTURE, COMP
Bilirubin, Urine: NEGATIVE
Bilirubin: NEGATIVE
Blood, Urine: NEGATIVE
Blood: NEGATIVE
Glucose, UA: NEGATIVE
Glucose: NEGATIVE
Ketone: NEGATIVE
Ketones, Urine: NEGATIVE
Leukocyte Esterase, Urine: NEGATIVE
Leukocyte Esterase: NEGATIVE
Nitrite, Urine: NEGATIVE
Nitrites: NEGATIVE
Protein, UA: NEGATIVE
Protein: NEGATIVE
Specific Gravity, UA: 1.012 NA (ref 1.005–1.030)
Specific Gravity: 1.012 (ref 1.005–1.030)
Urobilinogen, Urine: 0.2 mg/dL (ref 0.2–1.0)
Urobilinogen: 0.2 mg/dL (ref 0.2–1.0)
pH (UA): 6.5 (ref 5.0–7.5)
pH, UA: 6.5 NA (ref 5.0–7.5)

## 2021-04-08 LAB — CBC WITH AUTO DIFFERENTIAL
Basophils %: 1 %
Basophils Absolute: 0.1 10*3/uL (ref 0.0–0.2)
Eosinophils %: 0 %
Eosinophils Absolute: 0 10*3/uL (ref 0.0–0.4)
Granulocyte Absolute Count: 0.1 10*3/uL (ref 0.0–0.1)
Hematocrit: 37.1 % — ABNORMAL LOW (ref 37.5–51.0)
Hemoglobin: 12.2 g/dL — ABNORMAL LOW (ref 13.0–17.7)
Immature Granulocytes: 1 %
Lymphocytes %: 14 %
Lymphocytes Absolute: 2.3 10*3/uL (ref 0.7–3.1)
MCH: 30.3 pg (ref 26.6–33.0)
MCHC: 32.9 g/dL (ref 31.5–35.7)
MCV: 92 fL (ref 79–97)
Monocytes %: 10 %
Monocytes Absolute: 1.7 10*3/uL — ABNORMAL HIGH (ref 0.1–0.9)
Neutrophils %: 74 %
Neutrophils Absolute: 12 10*3/uL — ABNORMAL HIGH (ref 1.4–7.0)
Platelets: 590 10*3/uL — ABNORMAL HIGH (ref 150–450)
RBC: 4.03 x10E6/uL — ABNORMAL LOW (ref 4.14–5.80)
RDW: 14.6 % (ref 11.6–15.4)
WBC: 16.3 10*3/uL — ABNORMAL HIGH (ref 3.4–10.8)

## 2021-04-08 LAB — MICROSCOPIC EXAMINATION
BACTERIA, URINE: NONE SEEN
Bacteria: NONE SEEN
Casts UA: NONE SEEN /lpf
Casts: NONE SEEN /lpf
Epithelial Cells, UA: NONE SEEN /hpf (ref 0–10)
Epithelial cells: NONE SEEN /hpf (ref 0–10)
RBC, UA: NONE SEEN /hpf (ref 0–2)
RBC: NONE SEEN /hpf (ref 0–2)
WBC, UA: NONE SEEN /hpf (ref 0–5)
WBC: NONE SEEN /hpf (ref 0–5)

## 2021-04-08 LAB — VITAMIN B12
Vitamin B-12: 1460 pg/mL — ABNORMAL HIGH (ref 232–1245)
Vitamin B12: 1460 pg/mL — ABNORMAL HIGH (ref 232–1245)

## 2021-04-08 LAB — CBC WITH AUTOMATED DIFF
ABS. BASOPHILS: 0.1 10*3/uL (ref 0.0–0.2)
ABS. EOSINOPHILS: 0 10*3/uL (ref 0.0–0.4)
ABS. IMM. GRANS.: 0.1 10*3/uL (ref 0.0–0.1)
ABS. MONOCYTES: 1.7 10*3/uL — ABNORMAL HIGH (ref 0.1–0.9)
ABS. NEUTROPHILS: 12 10*3/uL — ABNORMAL HIGH (ref 1.4–7.0)
Abs Lymphocytes: 2.3 10*3/uL (ref 0.7–3.1)
BASOPHILS: 1 %
EOSINOPHILS: 0 %
HCT: 37.1 % — ABNORMAL LOW (ref 37.5–51.0)
HGB: 12.2 g/dL — ABNORMAL LOW (ref 13.0–17.7)
IMMATURE GRANULOCYTES: 1 %
Lymphocytes: 14 %
MCH: 30.3 pg (ref 26.6–33.0)
MCHC: 32.9 g/dL (ref 31.5–35.7)
MCV: 92 fL (ref 79–97)
MONOCYTES: 10 %
NEUTROPHILS: 74 %
PLATELET: 590 10*3/uL — ABNORMAL HIGH (ref 150–450)
RBC: 4.03 x10E6/uL — ABNORMAL LOW (ref 4.14–5.80)
RDW: 14.6 % (ref 11.6–15.4)
WBC: 16.3 10*3/uL — ABNORMAL HIGH (ref 3.4–10.8)

## 2021-04-08 LAB — METABOLIC PANEL, COMPREHENSIVE
A-G Ratio: 1.2 (ref 1.2–2.2)
ALT (SGPT): 54 IU/L — ABNORMAL HIGH (ref 0–44)
AST (SGOT): 18 IU/L (ref 0–40)
Albumin: 4 g/dL — ABNORMAL LOW (ref 4.1–5.2)
Alk. phosphatase: 112 IU/L (ref 44–121)
BUN/Creatinine ratio: 15 (ref 9–20)
BUN: 14 mg/dL (ref 6–20)
Bilirubin, total: <0.2 mg/dL (ref 0.0–1.2)
CO2: 26 mmol/L (ref 20–29)
Calcium: 9.7 mg/dL (ref 8.7–10.2)
Chloride: 103 mmol/L (ref 96–106)
Creatinine: 0.95 mg/dL (ref 0.76–1.27)
GLOBULIN, TOTAL: 3.4 g/dL (ref 1.5–4.5)
Glucose: 101 mg/dL — ABNORMAL HIGH (ref 65–99)
Potassium: 5.2 mmol/L (ref 3.5–5.2)
Protein, total: 7.4 g/dL (ref 6.0–8.5)
Sodium: 142 mmol/L (ref 134–144)
eGFR: 113 mL/min/{1.73_m2} (ref >59)

## 2021-04-09 LAB — HCV INTERPRETATION

## 2021-04-09 LAB — HEPATITIS C AB, RFLX TO QT BY PCR
HCV AB, 144035: <0.1 s/co ratio (ref 0.0–0.9)
HCV Ab: <0.1 s/co ratio (ref 0.0–0.9)

## 2021-04-09 LAB — SPECIMEN STATUS REPORT

## 2021-04-14 ENCOUNTER — Ambulatory Visit: Admit: 2021-04-14 | Attending: Physician Assistant | Primary: Physician Assistant

## 2021-04-14 ENCOUNTER — Ambulatory Visit: Attending: Physician Assistant | Primary: Physician Assistant

## 2021-04-14 DIAGNOSIS — Z0000 Encounter for general adult medical examination without abnormal findings: Secondary | ICD-10-CM

## 2021-04-14 LAB — AMB POC COMPLETE CBC,AUTOMATED ENTER
ABS. LYMPHS (POC): 1.8 10*3/uL (ref 1.2–3.4)
HCT (POC): 37.4 % (ref 35.0–60.0)
HGB (POC): 12.1 g/dL (ref 11–18)
Hematocrit, POC: 37.4 % (ref 35.0–60.0)
Hemoglobin, POC: 12.1 g/dL (ref 11–18)
LYMPHOCYTES (POC): 20.7 % (ref 20.5–51.1)
Lymphocyte %: 20.7 % (ref 20.5–51.1)
Lymphs Abs: 1.8 10*3/uL (ref 1.2–3.4)
MCV (POC): 90.3 fL (ref 80.0–99.9)
MCV: 90.3 fL (ref 80.0–99.9)
PLATELET (POC): 582 10*3/uL — AB (ref 150–450)
Platelet Count, POC: 582 10*3/uL — AB (ref 150–450)
RBC (POC): 4.14 M/uL (ref 4.00–6.00)
RBC, POC: 4.14 M/uL (ref 4.00–6.00)
WBC (POC): 8.9 10*3/uL (ref 4.5–10.5)
WBC, POC: 8.9 10*3/uL (ref 4.5–10.5)

## 2021-04-14 MED ORDER — METOPROLOL SUCCINATE SR 50 MG 24 HR TAB
50 mg | ORAL_TABLET | Freq: Every day | ORAL | 3 refills | Status: DC
Start: 2021-04-14 — End: 2022-03-14

## 2021-04-14 MED ORDER — GABAPENTIN 100 MG CAP
100 mg | ORAL_CAPSULE | Freq: Three times a day (TID) | ORAL | 3 refills | Status: DC
Start: 2021-04-14 — End: 2021-06-28

## 2021-04-14 NOTE — Progress Notes (Signed)
Comprehensive Visit    Hunter Lin is a 30 y.o. male who presents for his comprehensive visit.      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     he does minimal exercise. No CP, SOB or dyspnea.  Denies BRBPR. no NVDC, no urinary sx, no pain, HA, dizziness or weakness.  No fever, chills, night sweats or cough.     Eye exam: >5 yrs  Dental cleaning: >4 yrs     Past Medical History:   Diagnosis Date   ??? ADHD     Adderall 5mg  daily    ??? Alcohol abuse 2012   ??? Anxiety and depression     F/b [facility]     ??? COVID 03/2021   ??? Hypertension     metoprolol 50mg     ??? Neuropathy     gabapentin 100mg  TID   ??? Substance abuse (HCC)     Nitrous Oxide      History reviewed. No pertinent surgical history.  Current Outpatient Medications   Medication Sig Dispense Refill   ??? gabapentin (NEURONTIN) 100 mg capsule Take 1 Capsule by mouth three (3) times daily. Max Daily Amount: 300 mg. 90 Capsule 3   ??? metoprolol succinate (TOPROL-XL) 50 mg XL tablet Take 1 Tablet by mouth daily. 90 Tablet 3   ??? dextroamphetamine-amphetamine (ADDERALL) 5 mg tablet Take 5 mg by mouth.       No Known Allergies  Social History     Tobacco Use   ??? Smoking status: Never Smoker   ??? Smokeless tobacco: Never Used   Substance Use Topics   ??? Alcohol use: Not Currently     Comment: Sober since 11/29/2020      Family History   Problem Relation Age of Onset   ??? Ovarian Cancer Mother    ??? Colon Cancer Father    ??? Hypertension Father    ??? Depression Father    ??? Colon Cancer Maternal Grandfather    ??? COPD Paternal Grandmother    ??? Heart Attack Paternal Grandfather          Depression controlled with mental health therapy       Routine maintenance:          Health Maintenance   Topic Date Due   ??? Pneumococcal 0-64 years (1 - PCV) Never done   ??? DTaP/Tdap/Td series (1 - Tdap) Never done   ??? COVID-19  Vaccine (3 - Booster for Pfizer series) 08/16/2020   ??? Flu Vaccine (Season Ended) 07/14/2021   ??? Depression Monitoring  04/14/2022   ??? Hepatitis C Screening  Completed        ROS: as per HPI otherwise NEG    Objective:  Visit Vitals  BP 130/80   Temp 98.2 ??F (36.8 ??C)   Ht 5\' 10"  (1.778 m)   Wt 214 lb (97.1 kg)   BMI 30.71 kg/m??       Physical Exam  Well developed, well nourished, caucasian male, well groomed, in NAD  HEENT -- NC/AT, PERRL, TM intact, Throat w/o erythema, tongue midline  Neck -- Supple, non-tender, no LAD. no overt thyromegaly.   Heart -- RRR.  Lungs -- CTA.  Abd -- Soft. NT/ND. No masses. BS present.  Extremities -- No LE edema b/l, no cyanosis or erythema, distal  pulses intact.  Skin -- dry, warm, no rash or lesions noted.  Neuro -- non-focal exam, symmetrical facial expressions, moves all extremities  Light touch sensation intact, good muscle strength     Recent Results (from the past 24 hour(s))   AMB POC COMPLETE CBC,AUTOMATED ENTER    Collection Time: 04/14/21  9:04 AM   Result Value Ref Range    WBC (POC) 8.9 4.5 - 10.5 K/uL    LYMPHOCYTES (POC) 20.7 20.5 - 51.1 %    MONOCYTES (POC)      GRANULOCYTES (POC)      ABS. LYMPHS (POC) 1.8 1.2 - 3.4 K/uL    ABS. MONOS (POC)      ABS. GRANS (POC)      RBC (POC) 4.14 4.00 - 6.00 M/uL    HGB (POC) 12.1 11 - 18 g/dL    HCT (POC) 00.9 38.1 - 60.0 %    MCV (POC) 90.3 80.0 - 99.9 fL    MCH (POC)      MCHC (POC)      RDW (POC)      PLATELET (POC) 582 (A) 150 - 450 K/uL    MPV (POC)           Assessment/Plan:      ICD-10-CM ICD-9-CM    1. General medical exam  Z00.00 V70.9 AMB POC COMPLETE CBC,AUTOMATED ENTER      LIPID PANEL      TSH REFLEX TO T4   2. Substance abuse (HCC)  F19.10 305.90 REFERRAL TO NEUROLOGY   3. Hypertension, unspecified type  I10 401.9 metoprolol succinate (TOPROL-XL) 50 mg XL tablet      REFERRAL TO NEUROLOGY   4. Encounter for medication refill  Z76.0 V68.1 gabapentin (NEURONTIN) 100 mg capsule      metoprolol succinate (TOPROL-XL) 50  mg XL tablet   5. Neuropathy  G62.9 355.9 gabapentin (NEURONTIN) 100 mg capsule      REFERRAL TO NEUROLOGY           Routine maintenance as above.  Prior labs rev'd, Lipids sent, cbc repeated today-wnl   Diet & exercise regimen rev'd, weight loss encouraged   Advised reg dental & eye exam  Testicular self checks encouraged   Discussed vaccines due, declines at present    Encouraged to d/c substance abuse, suggest rehab   Medication compliance stressed, Gabapentin 100mg  TID as dir  Referred to Neurology  Case rev'd with Dr. 9yr/PRN                Author:  Omolola Mittman A Rita Vialpando-Edwards, PA 04/14/2021 8:23 AM

## 2021-04-14 NOTE — Progress Notes (Signed)
Called patient

## 2021-04-15 LAB — LIPID PANEL
Cholesterol, Total: 138 mg/dL (ref 100–199)
Cholesterol, total: 138 mg/dL (ref 100–199)
HDL Cholesterol: 32 mg/dL — ABNORMAL LOW (ref >39)
HDL: 32 mg/dL — ABNORMAL LOW (ref >39)
LDL Calculated: 90 mg/dL (ref 0–99)
LDL, calculated: 90 mg/dL (ref 0–99)
Triglyceride: 82 mg/dL (ref 0–149)
Triglycerides: 82 mg/dL (ref 0–149)
VLDL, calculated: 16 mg/dL (ref 5–40)
VLDL: 16 mg/dL (ref 5–40)

## 2021-04-15 LAB — TSH, REFLEX TO T4: TSH: 2.18 u[IU]/mL (ref 0.450–4.500)

## 2021-04-15 LAB — TSH REFLEX TO T4: TSH: 2.18 u[IU]/mL (ref 0.450–4.500)

## 2021-04-27 ENCOUNTER — Ambulatory Visit: Admit: 2021-04-27 | Attending: Physician Assistant | Primary: Physician Assistant

## 2021-04-27 ENCOUNTER — Ambulatory Visit: Attending: Physician Assistant | Primary: Physician Assistant

## 2021-04-27 DIAGNOSIS — G629 Polyneuropathy, unspecified: Secondary | ICD-10-CM

## 2021-04-27 NOTE — Progress Notes (Signed)
Hunter Lin is a 30 y.o. male and presents with   Chief Complaint   Patient presents with   ??? Follow-up         Pt here for f/u, needs forms filled out for FMLA for upcoming appts   Works as a Arts development officer at [employer]   Request copies of today's note for employer     Neuropathy - still with pins and needles of both legs,   Is taking Gabapentin as dir with some improvement, but sx not completely resolved, no ill effects from meds  awaiting Neurology appt in Aug     Anxiety & depression -- was f/b [facility]    Is making lifestyle changes   Increasing daily activity, joined a gym, has stopped substance use (Nitrous Oxide) as of 04/15/2021;   stopped ETOH on Nov 29, 2020    HTN -- taking Metoprolol as dir, denies HA, vision changes or dizziness    Denies cp, sob or dyspnea. No NVDC or Urinary sx    Current Outpatient Medications   Medication Sig Dispense Refill   ??? gabapentin (NEURONTIN) 100 mg capsule Take 1 Capsule by mouth three (3) times daily. Max Daily Amount: 300 mg. 90 Capsule 3   ??? metoprolol succinate (TOPROL-XL) 50 mg XL tablet Take 1 Tablet by mouth daily. 90 Tablet 3   ??? dextroamphetamine-amphetamine (ADDERALL) 5 mg tablet Take 5 mg by mouth.       No Known Allergies  Past Medical History:   Diagnosis Date   ??? ADHD     Adderall 5mg  daily    ??? Alcohol abuse 2012   ??? Anxiety and depression     F/b [facility]     ??? COVID 03/2021   ??? Hypertension     metoprolol 50mg     ??? Neuropathy     gabapentin 100mg  TID   ??? Substance abuse (HCC)     Nitrous Oxide      No past surgical history on file.  Family History   Problem Relation Age of Onset   ??? Ovarian Cancer Mother    ??? Colon Cancer Father    ??? Hypertension Father    ??? Depression Father    ??? Colon Cancer Maternal Grandfather    ??? COPD Paternal Grandmother    ??? Heart Attack Paternal Grandfather      Social History     Tobacco Use   ??? Smoking status: Never Smoker   ??? Smokeless tobacco: Never Used   Substance Use Topics   ??? Alcohol  use: Not Currently     Comment: Sober since 11/29/2020        ROS: as per HPI otherwise NEG    Objective:  Visit Vitals  BP 130/80   Temp 97.7 ??F (36.5 ??C)   Ht 5\' 10"  (1.778 m)   Wt 212 lb (96.2 kg)   BMI 30.42 kg/m??       In NAD. Alert.  Heart -- RRR.  Lungs -- CTA.  Abdomen -- Benign   Extremities -- No edema        No results found for this or any previous visit (from the past 24 hour(s)).    Assessment/Plan:    ICD-10-CM ICD-9-CM    1. Neuropathy  G62.9 355.9    2. Anxiety and depression  F41.9 300.00     F32.A 311    3. Hypertension, unspecified type  I10 401.9    4. Substance abuse (HCC)  F19.10  305.90    5. Encounter for medication refill  Z76.0 V68.1          Pt counseled extensively, continue lifestyle changes  Encouraged to f/u with mental health therapist - has a list  Can increase Gabapentin 200 mg TID   To send FMLA forms   face to face time >20 min           Author:  Arwin Bisceglia A Bluford Sedler-Edwards, PA 04/28/2021 10:01 AM

## 2021-04-28 NOTE — Telephone Encounter (Signed)
Patient dropped off FMLA papers for [name] to fill out, when they are ready he would also list a copy of his lab results as well please    Pt ph# [phone]

## 2021-04-29 NOTE — Telephone Encounter (Signed)
S/w pt, informed forms filled, faxed and scanned into chart, can pick up originals at front desk.

## 2021-05-30 ENCOUNTER — Telehealth: Payer: Self-pay | Admitting: Family Medicine

## 2021-05-30 DIAGNOSIS — R29898 Other symptoms and signs involving the musculoskeletal system: Secondary | ICD-10-CM | POA: Insufficient documentation

## 2021-05-30 NOTE — Assessment & Plan Note (Signed)
Post covid.  First tingling, now weakness.  Needs WU

## 2021-05-30 NOTE — Telephone Encounter (Signed)
Not a phone call - spoke with father (physician) who asked if I would see.  Patient had COVID last winter.  Has had leg tingling since.  Now seems to have motor weakness (stepage gait and uses had rails to support going up with stairs.)  No upper extremity complaints.  Yes, I will see.  He is out of town and we will arrange a time.  I have enough information now to ordered blood work and NCT/EMG in advance of the visit.

## 2021-06-02 NOTE — Telephone Encounter (Signed)
Called [facility] and was informed that a referral needs to be placed and documented EMG only.  This was added and placed in their WQ.  I called and updated emergency contact and she will pass info onto the patient. Rheanna Sergent,CMA

## 2021-06-02 NOTE — Addendum Note (Signed)
Addended by: Henri Medal on: 06/02/2021 02:16 PM   Modules accepted: Orders

## 2021-06-09 ENCOUNTER — Encounter: Payer: Self-pay | Admitting: Family Medicine

## 2021-06-09 ENCOUNTER — Telehealth (INDEPENDENT_AMBULATORY_CARE_PROVIDER_SITE_OTHER): Payer: Self-pay | Admitting: Family Medicine

## 2021-06-09 DIAGNOSIS — R29898 Other symptoms and signs involving the musculoskeletal system: Secondary | ICD-10-CM

## 2021-06-09 NOTE — Assessment & Plan Note (Signed)
Unclear etiology.  From a timing standpoint, it is related to COVID.  Seems like a mild Nelia Shi type illness.  Regardless of my speculation and Cedric's impression that he is improving (not well), he needs specialty care and definitive testing beginning with NCV and EMG.  Prompt testing is indicated.

## 2021-06-09 NOTE — Progress Notes (Addendum)
    SUBJECTIVE:   CHIEF COMPLAINT / HPI:   Agrees to video visit. Illness began with a gravel sensation on soles of feet - about the time he had covid - May 2022.  Had numbness and tingling of feet and hands.  Then found to be annemaic on initial labs.  FU labs showed anemia had resolved.  B12 and TSH was normal.  Had definite weakness and gait disturbance.  Need to hold rales going up stairs.  Had two falls.  Seems like definite leg weakness.  Had peeling of palms and soles.  Sensation of calf muscle tightness.  Feels like weakness has improved some.  Also feels like the numbness/dysasthesias are improving.  Father is a physician and feels he has foot drop.  From Jimel's reading, foot drop accurately describes his gait.   We were trying to get him to neuro ASAP, either here or in [place].  Now has a neuro appointment in [place] on Aug 16.  He believes the neurologist there knows from his [place] PCP than NCV and EMG are indicated.  No upper extremity weakness.  No bowel or bladder changes.  Weakness was isolated to bilateral lower ext  No recent immunizations   Visit duration was 23 minutes.     OBJECTIVE:   No vitals Mentation, cognition, speech normal  ASSESSMENT/PLAN:   Leg weakness, bilateral Unclear etiology.  From a timing standpoint, it is related to COVID.  Seems like a mild Nelia Shi type illness.  Regardless of my speculation and Trygg's impression that he is improving (not well), he needs specialty care and definitive testing beginning with NCV and EMG.  Prompt testing is indicated.       Moses Manners, MD [facility]    I was in my office and the patient was at his home.

## 2021-06-09 NOTE — Patient Instructions (Signed)
I will try to expidite the neuro referral.

## 2021-06-13 ENCOUNTER — Ambulatory Visit: Payer: 59 | Admitting: Family Medicine

## 2021-06-28 ENCOUNTER — Ambulatory Visit: Admit: 2021-06-28 | Payer: BLUE CROSS/BLUE SHIELD | Attending: Neurology | Primary: Physician Assistant

## 2021-06-28 ENCOUNTER — Ambulatory Visit: Attending: Neurology | Primary: Physician Assistant

## 2021-06-28 DIAGNOSIS — G629 Polyneuropathy, unspecified: Secondary | ICD-10-CM

## 2021-06-28 MED ORDER — GABAPENTIN 300 MG CAP
300 mg | ORAL_CAPSULE | Freq: Three times a day (TID) | ORAL | 1 refills | Status: DC
Start: 2021-06-28 — End: 2021-09-13

## 2021-06-28 NOTE — Progress Notes (Signed)
Chief Complaint   Patient presents with    New Patient     Complains of numbness and tingling in bilateral feet. Started around April 2022.        HISTORY OF PRESENT ILLNESS  Hunter Lin is a 30 y.o. male who came in for neurological evaluation requested by his PCP Dr. Oneida Alar.  He has a history of fall polysubstance abuse including heavy alcohol drinking for about 6 years where he was drinking at least 8 alcoholic drinks per day.  He has also used other drugs such as ketamine and nitrous oxide.  Does not drink alcohol anymore and has been in rehab twice but will occasionally use other substances.  Back in April he started to notice an uncomfortable/tingling sensation in his legs below the knees and now it seems to have settled down in his toes and the bottoms of his feet.  He is also having difficulty with balance and sometimes going up and down the steps.  He thinks he cannot dorsiflex his ankles as well as he used to.  No symptoms in the upper extremities.  No changes in bladder/bowel function.  He is currently on gabapentin 100 mg up to 3 times daily which does not really help.  Currently working as a Research officer, political party at [employer]    Past Medical History:   Diagnosis Date    ADHD     Adderall 73m daily     Alcohol abuse 2012    Anxiety and depression     F/b [facility]      COVID 03/2021    Hypertension     metoprolol 548m    Neuropathy     gabapentin 10051mID    Substance abuse (HCC)     Nitrous Oxide      Current Outpatient Medications   Medication Sig    hydrOXYzine pamoate (VISTARIL) 50 mg capsule TAKE ONE CAPSULE BY MOUTH EVERY DAY AS NEEDED FOR ANXIETY    gabapentin (NEURONTIN) 300 mg capsule Take 1 Capsule by mouth three (3) times daily. Max Daily Amount: 900 mg.    metoprolol succinate (TOPROL-XL) 50 mg XL tablet Take 1 Tablet by mouth daily.    amphetamine-dextroamphetamine XR (ADDERALL XR) 10 mg XR capsule TAKE 1 CAPSULE BY MOUTH EVERY MORNING     No current facility-administered medications  for this visit.     No Known Allergies  Family History   Problem Relation Age of Onset    Cancer Mother     Ovarian Cancer Mother     Cancer Father     Neuropathy Father     Colon Cancer Father     Hypertension Father     Depression Father     Colon Cancer Maternal Grandfather     COPD Paternal Grandmother     Heart Attack Paternal Grandfather      Social History     Tobacco Use    Smoking status: Some Days     Types: Cigarettes    Smokeless tobacco: Never   Vaping Use    Vaping Use: Every day    Substances: Nicotine, THC    Devices: Disposable   Substance Use Topics    Alcohol use: Not Currently     Comment: Sober since 11/29/2020    Drug use: Yes     Frequency: 2.0 times per week     Types: Marijuana, Cocaine     Comment: catamine, cocaine once a month     No past surgical  history on file.      REVIEW OF SYSTEMS  Review of Systems - History obtained from the patient  Psychological ROS: negative  ENT ROS: negative  Hematological and Lymphatic ROS: negative  Endocrine ROS: negative  Respiratory ROS: no cough, shortness of breath, or wheezing  Cardiovascular ROS: no chest pain or dyspnea on exertion  Gastrointestinal ROS: no abdominal pain, change in bowel habits, or black or bloody stools  Genito-Urinary ROS: no dysuria, trouble voiding, or hematuria  Musculoskeletal ROS: negative  Dermatological ROS: negative      PHYSICAL EXAMINATION:    Visit Vitals  BP (!) 140/88   Pulse 95   Resp 18   Wt 202 lb (91.6 kg)   SpO2 98%   BMI 28.98 kg/m??     General:  Well groomed individual in no acute distress.    Neck: Supple, nontender, no bruits, no pain with resistance to active range of motion.    Heart: Regular rate and rhythm.  Normal S1S2.  Lungs:  Equal chest expansion, no cough, no wheeze  Musculoskeletal:  Extremities revealed no edema and had full range of motion of joints.    Psych:  Good mood and bright affect    NEUROLOGICAL EXAMINATION:     Mental Status:   Alert and oriented to person, place, and time with recent  and remote memory intact.  Attention span and concentration are normal. Speech is fluent.      Cranial Nerves:    II, III, IV, VI:  Visual acuity grossly intact. Visual fields are normal.    Pupils are equal, round, and reactive to light.    Extra-ocular movements are full and fluid.  Fundoscopic exam was benign, no ptosis or nystagmus.   V-XII: Hearing is grossly intact.  Facial features are symmetric, with normal sensation and strength.  The palate rises symmetrically and the tongue protrudes midline.  Sternocleidomastoids 5/5.      Motor Examination: Normal tone, bulk, and strength. 5/5 muscle strength throughout except for effort to strength testing at dorsiflexion of the ankles bilaterally.  No cogwheel rigidity or clonus present.      Sensory exam: Mildly impaired pinprick sensation over the toes and dorsum of foot when compared to distal leg.  Intact temperature, and vibration sense.  Normal proprioception.      Coordination:  Finger to nose and rapid arm movement testing was normal.   No resting or intention tremor    Gait and Station:  Steady while walking on toes, heels, and with tandem walking.  Normal arm swing.  No Rhomberg or pronator drift.   No muscle wasting or fasiculations noted.      Reflexes:  DTRs 2+ throughout.  Toes downgoing.        LABS / IMAGING  Lab Results   Component Value Date/Time    WBC 16.3 (H) 04/07/2021 10:56 AM    HGB (POC) 12.1 04/14/2021 09:04 AM    HGB 12.2 (L) 04/07/2021 10:56 AM    HCT (POC) 37.4 04/14/2021 09:04 AM    HCT 37.1 (L) 04/07/2021 10:56 AM    PLATELET 590 (H) 04/07/2021 10:56 AM    MCV 92 04/07/2021 10:56 AM     Lab Results   Component Value Date/Time    Sodium 142 04/07/2021 10:56 AM    Potassium 5.2 04/07/2021 10:56 AM    Chloride 103 04/07/2021 10:56 AM    CO2 26 04/07/2021 10:56 AM    Anion gap 12 11/29/2020 08:54 PM  Glucose 101 (H) 04/07/2021 10:56 AM    BUN 14 04/07/2021 10:56 AM    Creatinine 0.95 04/07/2021 10:56 AM    BUN/Creatinine ratio 15  04/07/2021 10:56 AM    GFR est AA >60 11/29/2020 08:54 PM    GFR est non-AA >60 11/29/2020 08:54 PM    Calcium 9.7 04/07/2021 10:56 AM    Bilirubin, total <0.2 04/07/2021 10:56 AM    Alk. phosphatase 112 04/07/2021 10:56 AM    Protein, total 7.4 04/07/2021 10:56 AM    Albumin 4.0 (L) 04/07/2021 10:56 AM    Globulin 4.1 (H) 11/29/2020 08:54 PM    A-G Ratio 1.2 04/07/2021 10:56 AM    ALT (SGPT) 54 (H) 04/07/2021 10:56 AM    AST (SGOT) 18 04/07/2021 10:56 AM     Lab Results   Component Value Date/Time    Vitamin B12 1,460 (H) 04/07/2021 10:56 AM     Lab Results   Component Value Date/Time    TSH 2.180 04/14/2021 08:51 AM       ASSESSMENT    ICD-10-CM ICD-9-CM    1. Peripheral polyneuropathy  G62.9 356.9 EMG NCV MOTOR WO F/WAVE PER NERVE      gabapentin (NEURONTIN) 300 mg capsule      2. Muscle weakness affecting movement of foot  M62.81 728.87 EMG NCV MOTOR WO F/WAVE PER NERVE          DISCUSSION  Mr. Delonta Eyer appears to have developed peripheral neuropathy which I suspect is most likely due to polysubstance use, specifically heavy alcohol drinking in the past.  He does not drink alcohol anymore but will will occasionally use other substances.   He is experiencing unpleasant sensations related to neuropathy.  For symptomatic relief, he may try increasing the dose of gabapentin to 300 mg 3 times daily  He has difficulty dorsiflexing his knees bilaterally which appears to be a functional issue but I have scheduled EMG/NCV for further diagnostic clarification and to see if he would need any additional work-up    Thank you for allowing me to participate in the care of Mr. Jeffcoat. Please feel free to contact me if you have any questions. I will be happy to follow to follow him along with you.      Link Snuffer, MD  Diplomate, American Board of Psychiatry & Neurology (Neurology)  Diplomate, American Board of Psychiatry & Neurology (Clinical Neurophysiology)  Diplomate, American Board of Electrodiagnostic  Medicine

## 2021-06-30 NOTE — Telephone Encounter (Signed)
Pt called and would like to speak to nurse in ref to a work note he needs due to the medication he is on. It is making him sleep late and his employer needs  proof that he is on the medication and side effects.

## 2021-07-05 NOTE — Telephone Encounter (Signed)
Returned call to patient, contact verified. Patient is requesting FMLA forms to be completed due to medication side effects of drowsiness. Deferred patient to PCP for FMLA requests.

## 2021-07-05 NOTE — Telephone Encounter (Signed)
Pt needs a new FMLA form filled out. Pt stated  they can come pick up form. Please call       Ph# [phone] ( Please call asap)

## 2021-07-08 NOTE — Telephone Encounter (Signed)
Pt would like to speak with the PA about his FMLA form. Pt stated that his specialist will not file out the form and he has no other choice but to see if PA can fill the form out he really needs it. Please call       Ph# [phone]

## 2021-07-26 ENCOUNTER — Encounter: Payer: Self-pay | Admitting: Neurology

## 2021-07-28 ENCOUNTER — Ambulatory Visit: Admit: 2021-07-28 | Payer: BLUE CROSS/BLUE SHIELD | Primary: Physician Assistant

## 2021-07-28 ENCOUNTER — Ambulatory Visit: Primary: Physician Assistant

## 2021-07-28 DIAGNOSIS — G6289 Other specified polyneuropathies: Secondary | ICD-10-CM

## 2021-07-28 NOTE — Progress Notes (Signed)
Progress Notes by Lance Bosch, MD at 07/28/21 0800                Author: Lance Bosch, MD  Service: --  Author Type: Physician       Filed: 07/28/21 0945  Encounter Date: 07/28/2021  Status: Signed          Editor: Lance Bosch, MD (Physician)                       [facility]   [facility]   [address] [place], [place] [address]   [place] [address]   Phone [phone] Fax [phone]   Test Date:  07/28/2021             Patient:  Hunter Lin  DOB:  06-May-1991  Physician:  Wells Guiles, MD            Sex:  Male  Height:  5\' 10"   Ref Phys:  , MD            ID#:  Wells Guiles   Weight:  202 lbs.  Technician:  [id number]        Patient Complaints:   Leg weakness      Patient History / Exam:   30 year old male with history of alcohol abuse, polysubstance use including nitrous oxide inhalation.  He is being evaluated for weakness, numbness and painful paresthesias in both lower extremities.      On exam: Alert and fully oriented.  Cranial nerves II through XII intact.  Muscle tone and bulk normal.  Strength normal in all extremities except for weakness of the ankle dorsiflexion bilaterally at 3 -4/5.  DTRs 2/2 and symmetric.  Ankle jerks difficult to elicit.  Sensation intact to all modalities.  Gait slow but steady         NCV & EMG Findings:   Evaluation of the left peroneal motor and the right peroneal motor nerves showed no response (Ankle), no response (B Fib), and no response (Poplt).  The left tibial motor and the right tibial motor  nerves showed no response (Ankle) and no response (Knee).  All remaining nerves  were within normal limits.        Needle evaluation of the left anterior tibialis, the left gastroc, the left flexor digitorum longus, the right anterior tibialis, the right gastroc, the right flexor digitorum longus, the right vastus lateralis, and the right first dorsal interosseous  muscles showed increased  insertional activity, slightly increased spontaneous activity, and slightly increased polyphasic potentials.  The left peroneus longus and the right peroneus longus muscles showed increased insertional activity, moderately increased  spontaneous activity, and slightly increased polyphasic potentials.  The left vastus lateralis muscle showed slightly increased spontaneous activity and slightly increased polyphasic potentials.  The right pronator teres muscle showed slightly increased  polyphasic potentials.  All remaining muscles (as indicated in the following table) showed no evidence of electrical instability.        Impression:      The electrodiagnostic testing shows evidence of pure motor axonal neuropathy with mild acute on chronic changes seen in both lower extremity muscles.  Mild acute on chronic neurogenic changes were seen in the distal right upper extremity muscles/first  dorsal interosseous as well.  Proximal right upper extremity muscles were unremarkable.   This could be related to nitrous oxide inhalation in the past as there have been some  case reports of similar motor neuropathy with it.   Continue clinical follow-up.  He is improving slowly which is reassuring.  If symptoms worsen, he will let me know and we can consider additional work-up   Follow-up EMG in 3 to 4 months         ___________________________   Wells Guiles, MD           Nerve Conduction Studies   Anti Sensory Summary Table                     Stim Site  NR  Peak (ms)  Norm Peak (ms)  P-T Amp (??V)  Norm P-T Amp  Onset (ms)  Site1  Site2  Delta-P (ms)  Dist (cm)  Vel (m/s)  Norm Vel (m/s)       Left Sup Peroneal Anti Sensory (Ant Lat Mall)  30.3??C                   14 cm      3.1  <4.4  5.8  >5.0  2.1  14 cm  Ant Lat Mall  3.1  14.0  45  >32       Right Sup Peroneal Anti Sensory (Ant Lat Mall)  30.5??C                   14 cm      3.1  <4.4  5.1  >5.0  2.3  14 cm  Ant Lat Mall  3.1  14.0  45  >32       Left Sural Anti Sensory (Lat  Mall)  31.6??C                   Calf      4.0  <4.0  13.4  >5.0  3.3  Calf  Lat Mall  4.0  14.0  35  >35       Right Sural Anti Sensory (Lat Mall)  30.7??C                   Calf      3.7  <4.0  7.6  >5.0  2.8  Calf  Lat Mall  3.7  14.0  38  >35        Motor Summary Table                    Stim Site  NR  Onset (ms)  Norm Onset (ms)  O-P Amp (mV)  Norm O-P Amp  Site1  Site2  Delta-0 (ms)  Dist (cm)  Vel (m/s)  Norm Vel (m/s)       Left Peroneal Motor (Ext Dig Brev)  31.8??C                  Ankle  NR    <6.1    >2.5  B Fib  Ankle    38.0    >38     B Fib  NR          Poplt  B Fib    10.0    >40     Poplt  NR                           Right Peroneal Motor (Ext Dig Brev)  31.1??C                  Ankle  NR    <  6.1    >2.5  B Fib  Ankle    38.0    >38     B Fib  NR          Poplt  B Fib    10.0    >40     Poplt  NR                           Left Tibial Motor (Abd Hall Brev)  32.5??C                  Ankle  NR    <6.1    >3.0  Knee  Ankle    42.0    >35     Knee  NR                           Right Tibial Motor (Abd Hall Brev)  31.5??C                  Ankle  NR    <6.1    >3.0  Knee  Ankle    42.0    >35                  Knee  NR                            EMG                     Side  Muscle  Nerve  Root  Ins Act  Fibs  Psw  Amp  Dur  Poly  Recrt  Int Dennie Bible  Comment                   Left  AntTibialis  Dp Br Peronel  L4-5  Incr  Nml  1+  Nml  Nml  1+  Nml  Nml       Left  Peroneus Long  Sup Br Peronel  L5-S1  Incr  Nml  2+  Nml  Nml  1+  Nml  Nml                     Left  Gastroc  Tibial  S1-2  Incr  Nml  1+  Nml  Nml  1+  Nml  Nml                     Left  Flex Dig Long  Tibial  L5-S2  Incr  Nml  1+  Nml  Nml  1+  Nml  Nml                     Left  VastusLat  Femoral  L2-4  Nml  Nml  1+  Nml  Nml  1+  Nml  Nml                     Right  AntTibialis  Dp Br Peronel  L4-5  Incr  Nml  1+  Nml  Nml  1+  Nml  Nml       Right  Peroneus Long  Sup Br Peronel  L5-S1  Incr  Nml  2+  Nml  Nml  1+  Nml  Nml       Right  Gastroc  Tibial  S1-2  Incr  Nml  1+  Nml  Nml  1+  Nml  Nml       Right  Flex Dig Long  Tibial  L5-S2  Incr  Nml  1+  Nml  Nml  1+  Nml  Nml       Right  VastusLat  Femoral  L2-4  Incr  Nml  1+  Nml  Nml  1+  Nml  Nml       Right  1stDorInt  Ulnar  C8-T1  Incr  Nml  1+  Nml  Nml  1+  Nml  Nml                     Right  PronatorTeres  Median  C6-7  Nml  Nml  Nml  Nml  Nml  1+  Nml  Nml                     Right  Biceps  Musculocut  C5-6  Nml  Nml  Nml  Nml  Nml  0  Nml  Nml       Right  Triceps  Radial  C6-7-8  Nml  Nml  Nml  Nml  Nml  0  Nml  Nml                     Right  Deltoid  Axillary  C5-6  Nml  Nml  Nml  Nml  Nml  0  Nml  Nml               Waveforms:

## 2021-09-11 ENCOUNTER — Encounter

## 2021-09-13 MED ORDER — GABAPENTIN 300 MG CAP
300 mg | ORAL_CAPSULE | ORAL | 1 refills | Status: DC
Start: 2021-09-13 — End: 2021-12-15

## 2021-12-15 ENCOUNTER — Encounter

## 2021-12-15 MED ORDER — GABAPENTIN 300 MG CAP
300 mg | ORAL_CAPSULE | ORAL | 1 refills | Status: AC
Start: 2021-12-15 — End: ?

## 2022-03-14 MED ORDER — METOPROLOL SUCCINATE SR 50 MG 24 HR TAB
50 mg | ORAL_TABLET | Freq: Every day | ORAL | 0 refills | Status: AC
Start: 2022-03-14 — End: ?

## 2022-03-22 ENCOUNTER — Encounter: Payer: Self-pay | Admitting: Family Medicine

## 2022-03-22 ENCOUNTER — Ambulatory Visit (INDEPENDENT_AMBULATORY_CARE_PROVIDER_SITE_OTHER): Payer: BC Managed Care – PPO | Admitting: Family Medicine

## 2022-03-22 VITALS — BP 124/76 | HR 75 | Temp 97.8°F | Ht 70.0 in | Wt 260.0 lb

## 2022-03-22 DIAGNOSIS — F1911 Other psychoactive substance abuse, in remission: Secondary | ICD-10-CM | POA: Insufficient documentation

## 2022-03-22 DIAGNOSIS — F419 Anxiety disorder, unspecified: Secondary | ICD-10-CM

## 2022-03-22 DIAGNOSIS — G4700 Insomnia, unspecified: Secondary | ICD-10-CM | POA: Diagnosis not present

## 2022-03-22 DIAGNOSIS — I10 Essential (primary) hypertension: Secondary | ICD-10-CM

## 2022-03-22 DIAGNOSIS — F32A Depression, unspecified: Secondary | ICD-10-CM

## 2022-03-22 NOTE — Progress Notes (Signed)
? ?New Patient Office Visit ? ?Subjective   ? ?Patient ID: Brett Munoz, male    DOB: [birth year]  Age: 31 y.o. MRN: [id number] ? ?CC:  ?Chief Complaint  ?Patient presents with  ? Establish Care  ?  Just recently moved, needed PCP for prescriptions  ? ? ?HPI ?Brett Munoz presents to establish care. Moved here from [place].  ? ?States he has been taking metoprolol for HTN for the past 1 year and half  ? ?Reports taking Wellbutrin and sertraline for anxiety and depression. States he has a diagnosis of PTSD.  ? 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 He reports being clean and sober for 60 days.  ? ?Takes Trazodone for sleep prn. It causes him to have Vivid dreams.  ?States he also takes melatonin 3 mg ? ?Complains of fatigue which may be related to new medications.  ? ?Denies fever, chills, dizziness, chest pain, palpitations, shortness of breath, abdominal pain, N/V/D, urinary symptoms, LE edema.  ? ? ?Vape,s no smokeless tobacco  ? ?Single. No kids. 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 ? ? ?Outpatient Encounter Medications as of 03/22/2022  ?Medication Sig  ? buPROPion (WELLBUTRIN XL) 300 MG 24 hr tablet Take 300 mg by mouth daily.  ? metoprolol succinate (TOPROL-XL) 50 MG 24 hr tablet Take 1 tablet by mouth daily.  ? Omega-3 Fatty Acids (FISH OIL) 1000 MG CAPS Take by mouth.  ? sertraline (ZOLOFT) 100 MG tablet Take 100 mg by mouth every morning.  ? traZODone (DESYREL) 50 MG tablet Take by mouth.  ? ?No facility-administered encounter medications on file as of 03/22/2022.  ? ? ?Past Medical History:  ?Diagnosis Date  ? Internal hemorrhoids   ? ? ?History reviewed. No pertinent surgical history. ? ?Family History  ?Problem Relation Age of Onset  ? Cancer Mother   ? Cancer Father   ? Depression Father   ? High blood pressure Father   ? Arthritis Maternal Grandfather   ? Cancer Maternal Grandfather   ? Alcohol abuse Paternal Grandmother   ?  COPD Paternal Grandmother   ? Mental illness Paternal Grandmother   ? Alcohol abuse Paternal Grandfather   ? Heart attack Paternal Grandfather   ? Heart disease Paternal Grandfather   ? ? ?Social History  ? ?Socioeconomic History  ? Marital status: Single  ?  Spouse name: Not on file  ? Number of children: Not on file  ? Years of education: Not on file  ? Highest education level: Not on file  ?Occupational History  ? Not on file  ?Tobacco Use  ? Smoking status: Never  ? Smokeless tobacco: Never  ?Substance and Sexual Activity  ? Alcohol use: No  ?  Alcohol/week: 0.0 standard drinks  ? Drug use: No  ? Sexual activity: Not Currently  ?Other Topics Concern  ? Not on file  ?Social History Narrative  ? Not on file  ? ?Social Determinants of Health  ? ?Financial Resource Strain: Not on file  ?Food Insecurity: Not on file  ?Transportation Needs: Not on file  ?Physical Activity: Not on file  ?Stress: Not on file  ?Social Connections: Not on file  ?Intimate Partner Violence: Not on file  ? ? ?ROS ?Pertinent positives and negatives in the history of present illness. ? ?  ? ? ?Objective   ? ?BP 124/76 (BP Location: Left Arm, Patient Position: Sitting, Cuff Size: Large)  Pulse 75   Temp 97.8 ?F (36.6 ?C) (Temporal)   Ht 5\' 10"  (1.778 m)   Wt 260 lb (117.9 kg)   SpO2 97%   BMI 37.31 kg/m?  ? ?Physical Exam ?Alert and in no distress.  Cardiac exam shows a regular rate and rhythm.  Lungs are clear to auscultation. ? ? ?  ? ?Assessment & Plan:  ? ?Problem List Items Addressed This Visit   ? ?  ? Cardiovascular and Mediastinum  ? Hypertension - Primary  ?  BP controlled. Continue metoprolol.  ? ?  ?  ? Relevant Medications  ? metoprolol succinate (TOPROL-XL) 50 MG 24 hr tablet  ?  ? Other  ? Anxiety and depression  ?  Currently at [facility]. Involved in group and individual counseling. Continue medications.  ? ?  ?  ? Relevant Medications  ? traZODone (DESYREL) 50 MG tablet  ? sertraline (ZOLOFT) 100 MG tablet  ? buPROPion  (WELLBUTRIN XL) 300 MG 24 hr tablet  ? History of substance abuse (HCC)  ?  Reports doing well at [facility]. He is in counseling ? ?  ?  ? Insomnia  ?  Takes melatonin and trazodone. Continue therapy.  ? ?  ?  ? ? ?Return for As needed.  ? ?[facility], NP-C ? ? ?

## 2022-03-22 NOTE — Assessment & Plan Note (Signed)
BP controlled.  Continue metoprolol 

## 2022-03-22 NOTE — Assessment & Plan Note (Signed)
Takes melatonin and trazodone. Continue therapy.  ?

## 2022-03-22 NOTE — Patient Instructions (Signed)
It was a pleasure meeting you today.  Thank you for trusting [place] with your medical care. ? ?I would like to get your blood work results from where you had them done most recently. ?

## 2022-03-22 NOTE — Assessment & Plan Note (Addendum)
Reports doing well at [facility]. He is in counseling ?

## 2022-03-22 NOTE — Assessment & Plan Note (Signed)
Currently at [facility]. Involved in group and individual counseling. Continue medications.  ?

## 2022-08-29 ENCOUNTER — Ambulatory Visit (INDEPENDENT_AMBULATORY_CARE_PROVIDER_SITE_OTHER): Payer: Managed Care, Other (non HMO) | Admitting: Family Medicine

## 2022-08-29 ENCOUNTER — Encounter: Payer: Self-pay | Admitting: Family Medicine

## 2022-08-29 VITALS — BP 138/106 | HR 75 | Temp 97.6°F | Ht 70.0 in | Wt 267.0 lb

## 2022-08-29 DIAGNOSIS — I10 Essential (primary) hypertension: Secondary | ICD-10-CM | POA: Diagnosis not present

## 2022-08-29 DIAGNOSIS — F909 Attention-deficit hyperactivity disorder, unspecified type: Secondary | ICD-10-CM | POA: Insufficient documentation

## 2022-08-29 DIAGNOSIS — R198 Other specified symptoms and signs involving the digestive system and abdomen: Secondary | ICD-10-CM

## 2022-08-29 DIAGNOSIS — G629 Polyneuropathy, unspecified: Secondary | ICD-10-CM | POA: Insufficient documentation

## 2022-08-29 LAB — COMPREHENSIVE METABOLIC PANEL
ALT: 27 U/L (ref 0–53)
AST: 24 U/L (ref 0–37)
Albumin: 4.2 g/dL (ref 3.5–5.2)
Alkaline Phosphatase: 85 U/L (ref 39–117)
BUN: 16 mg/dL (ref 6–23)
CO2: 26 mEq/L (ref 19–32)
Calcium: 9.4 mg/dL (ref 8.4–10.5)
Chloride: 101 mEq/L (ref 96–112)
Creatinine, Ser: 1.11 mg/dL (ref 0.40–1.50)
GFR: 90.04 mL/min (ref >60.00)
Glucose, Bld: 82 mg/dL (ref 70–99)
Potassium: 4.3 mEq/L (ref 3.5–5.1)
Sodium: 135 mEq/L (ref 135–145)
Total Bilirubin: 0.4 mg/dL (ref 0.2–1.2)
Total Protein: 7.4 g/dL (ref 6.0–8.3)

## 2022-08-29 LAB — CBC WITH DIFFERENTIAL/PLATELET
Basophils Absolute: 0.1 10*3/uL (ref 0.0–0.1)
Basophils Relative: 1 % (ref 0.0–3.0)
Eosinophils Absolute: 0.2 10*3/uL (ref 0.0–0.7)
Eosinophils Relative: 2.2 % (ref 0.0–5.0)
HCT: 44 % (ref 39.0–52.0)
Hemoglobin: 14.5 g/dL (ref 13.0–17.0)
Lymphocytes Relative: 33.9 % (ref 12.0–46.0)
Lymphs Abs: 2.7 10*3/uL (ref 0.7–4.0)
MCHC: 33.1 g/dL (ref 30.0–36.0)
MCV: 85.4 fl (ref 78.0–100.0)
Monocytes Absolute: 1.1 10*3/uL — ABNORMAL HIGH (ref 0.1–1.0)
Monocytes Relative: 13.1 % — ABNORMAL HIGH (ref 3.0–12.0)
Neutro Abs: 4 10*3/uL (ref 1.4–7.7)
Neutrophils Relative %: 49.8 % (ref 43.0–77.0)
Platelets: 343 10*3/uL (ref 150.0–400.0)
RBC: 5.15 Mil/uL (ref 4.22–5.81)
RDW: 16.3 % — ABNORMAL HIGH (ref 11.5–15.5)
WBC: 8.1 10*3/uL (ref 4.0–10.5)

## 2022-08-29 LAB — URINALYSIS, ROUTINE W REFLEX MICROSCOPIC
Bilirubin Urine: NEGATIVE
Hgb urine dipstick: NEGATIVE
Ketones, ur: NEGATIVE
Leukocytes,Ua: NEGATIVE
Nitrite: NEGATIVE
RBC / HPF: NONE SEEN (ref 0–?)
Specific Gravity, Urine: 1.02 (ref 1.000–1.030)
Total Protein, Urine: NEGATIVE
Urine Glucose: NEGATIVE
Urobilinogen, UA: 0.2 (ref 0.0–1.0)
pH: 6 (ref 5.0–8.0)

## 2022-08-29 LAB — TSH: TSH: 5.81 u[IU]/mL — ABNORMAL HIGH (ref 0.35–5.50)

## 2022-08-29 NOTE — Patient Instructions (Addendum)
Please go downstairs for labs and a urine check today due to your blood pressure.   Your blood pressure is significantly elevated today. Keep an eye on your blood pressure at home. Take your medications daily and avoid energy drinks and having a lot of caffeine.   Follow up in 4-6 wks to recheck your blood pressure.   DASH Eating Plan DASH stands for Dietary Approaches to Stop Hypertension. The DASH eating plan is a healthy eating plan that has been shown to: Reduce high blood pressure (hypertension). Reduce your risk for type 2 diabetes, heart disease, and stroke. Help with weight loss. What are tips for following this plan? Reading food labels Check food labels for the amount of salt (sodium) per serving. Choose foods with less than 5 percent of the Daily Value of sodium. Generally, foods with less than 300 milligrams (mg) of sodium per serving fit into this eating plan. To find whole grains, look for the word "whole" as the first word in the ingredient list. Shopping Buy products labeled as "low-sodium" or "no salt added." Buy fresh foods. Avoid canned foods and pre-made or frozen meals. Cooking Avoid adding salt when cooking. Use salt-free seasonings or herbs instead of table salt or sea salt. Check with your health care provider or pharmacist before using salt substitutes. Do not fry foods. Cook foods using healthy methods such as baking, boiling, grilling, roasting, and broiling instead. Cook with heart-healthy oils, such as olive, canola, avocado, soybean, or sunflower oil. Meal planning  Eat a balanced diet that includes: 4 or more servings of fruits and 4 or more servings of vegetables each day. Try to fill one-half of your plate with fruits and vegetables. 6-8 servings of whole grains each day. Less than 6 oz (170 g) of lean meat, poultry, or fish each day. A 3-oz (85-g) serving of meat is about the same size as a deck of cards. One egg equals 1 oz (28 g). 2-3 servings of  low-fat dairy each day. One serving is 1 cup (237 mL). 1 serving of nuts, seeds, or beans 5 times each week. 2-3 servings of heart-healthy fats. Healthy fats called omega-3 fatty acids are found in foods such as walnuts, flaxseeds, fortified milks, and eggs. These fats are also found in cold-water fish, such as sardines, salmon, and mackerel. Limit how much you eat of: Canned or prepackaged foods. Food that is high in trans fat, such as some fried foods. Food that is high in saturated fat, such as fatty meat. Desserts and other sweets, sugary drinks, and other foods with added sugar. Full-fat dairy products. Do not salt foods before eating. Do not eat more than 4 egg yolks a week. Try to eat at least 2 vegetarian meals a week. Eat more home-cooked food and less restaurant, buffet, and fast food. Lifestyle When eating at a restaurant, ask that your food be prepared with less salt or no salt, if possible. If you drink alcohol: Limit how much you use to: 0-1 drink a day for women who are not pregnant. 0-2 drinks a day for men. Be aware of how much alcohol is in your drink. In the [place] one drink equals one 12 oz bottle of beer (355 mL), one 5 oz glass of wine (148 mL), or one 1 oz glass of hard liquor (44 mL). General information Avoid eating more than 2,300 mg of salt a day. If you have hypertension, you may need to reduce your sodium intake to 1,500 mg a day.  Work with your health care provider to maintain a healthy body weight or to lose weight. Ask what an ideal weight is for you. Get at least 30 minutes of exercise that causes your heart to beat faster (aerobic exercise) most days of the week. Activities may include walking, swimming, or biking. Work with your health care provider or dietitian to adjust your eating plan to your individual calorie needs. What foods should I eat? Fruits All fresh, dried, or frozen fruit. Canned fruit in natural juice (without added  sugar). Vegetables Fresh or frozen vegetables (raw, steamed, roasted, or grilled). Low-sodium or reduced-sodium tomato and vegetable juice. Low-sodium or reduced-sodium tomato sauce and tomato paste. Low-sodium or reduced-sodium canned vegetables. Grains Whole-grain or whole-wheat bread. Whole-grain or whole-wheat pasta. Brown rice. Orpah Cobb. Bulgur. Whole-grain and low-sodium cereals. Pita bread. Low-fat, low-sodium crackers. Whole-wheat flour tortillas. Meats and other proteins Skinless chicken or [place]. Ground chicken or [place]. Pork with fat trimmed off. Fish and seafood. Egg whites. Dried beans, peas, or lentils. Unsalted nuts, nut butters, and seeds. Unsalted canned beans. Lean cuts of beef with fat trimmed off. Low-sodium, lean precooked or cured meat, such as sausages or meat loaves. Dairy Low-fat (1%) or fat-free (skim) milk. Reduced-fat, low-fat, or fat-free cheeses. Nonfat, low-sodium ricotta or cottage cheese. Low-fat or nonfat yogurt. Low-fat, low-sodium cheese. Fats and oils Soft margarine without trans fats. Vegetable oil. Reduced-fat, low-fat, or light mayonnaise and salad dressings (reduced-sodium). Canola, safflower, olive, avocado, soybean, and sunflower oils. Avocado. Seasonings and condiments Herbs. Spices. Seasoning mixes without salt. Other foods Unsalted popcorn and pretzels. Fat-free sweets. The items listed above may not be a complete list of foods and beverages you can eat. Contact a dietitian for more information. What foods should I avoid? Fruits Canned fruit in a light or heavy syrup. Fried fruit. Fruit in cream or butter sauce. Vegetables Creamed or fried vegetables. Vegetables in a cheese sauce. Regular canned vegetables (not low-sodium or reduced-sodium). Regular canned tomato sauce and paste (not low-sodium or reduced-sodium). Regular tomato and vegetable juice (not low-sodium or reduced-sodium). Rosita Fire. Olives. Grains Baked goods made with fat, such  as croissants, muffins, or some breads. Dry pasta or rice meal packs. Meats and other proteins Fatty cuts of meat. Ribs. Fried meat. Tomasa Blase. Bologna, salami, and other precooked or cured meats, such as sausages or meat loaves. Fat from the back of a pig (fatback). Bratwurst. Salted nuts and seeds. Canned beans with added salt. Canned or smoked fish. Whole eggs or egg yolks. Chicken or [place] with skin. Dairy Whole or 2% milk, cream, and half-and-half. Whole or full-fat cream cheese. Whole-fat or sweetened yogurt. Full-fat cheese. Nondairy creamers. Whipped toppings. Processed cheese and cheese spreads. Fats and oils Butter. Stick margarine. Lard. Shortening. Ghee. Bacon fat. Tropical oils, such as coconut, palm kernel, or palm oil. Seasonings and condiments Onion salt, garlic salt, seasoned salt, table salt, and sea salt. Worcestershire sauce. Tartar sauce. Barbecue sauce. Teriyaki sauce. Soy sauce, including reduced-sodium. Steak sauce. Canned and packaged gravies. Fish sauce. Oyster sauce. Cocktail sauce. Store-bought horseradish. Ketchup. Mustard. Meat flavorings and tenderizers. Bouillon cubes. Hot sauces. Pre-made or packaged marinades. Pre-made or packaged taco seasonings. Relishes. Regular salad dressings. Other foods Salted popcorn and pretzels. The items listed above may not be a complete list of foods and beverages you should avoid. Contact a dietitian for more information. Where to find more information National Heart, Lung, and Blood Institute: PopSteam.is American Heart Association: www.heart.org Academy of Nutrition and Dietetics: www.eatright.org National Kidney Foundation:  www.kidney.org Summary The DASH eating plan is a healthy eating plan that has been shown to reduce high blood pressure (hypertension). It may also reduce your risk for type 2 diabetes, heart disease, and stroke. When on the DASH eating plan, aim to eat more fresh fruits and vegetables, whole grains, lean  proteins, low-fat dairy, and heart-healthy fats. With the DASH eating plan, you should limit salt (sodium) intake to 2,300 mg a day. If you have hypertension, you may need to reduce your sodium intake to 1,500 mg a day. Work with your health care provider or dietitian to adjust your eating plan to your individual calorie needs. This information is not intended to replace advice given to you by your health care provider. Make sure you discuss any questions you have with your health care provider. Document Revised: 10/03/2019 Document Reviewed: 10/03/2019 Elsevier Patient Education  [employer].

## 2022-08-29 NOTE — Assessment & Plan Note (Signed)
Exam benign. Discussed soft tissue asymmetry but no sign of hernia or anything worrisome. Follow up if he notices any changes or new symptoms.

## 2022-08-29 NOTE — Progress Notes (Signed)
Subjective:     Patient ID: Brett Munoz, male    DOB: [birth year], 31 y.o.   MRN: [id number]  Chief Complaint  Patient presents with   Hernia    Possible hernia 1st noticed 2-3 weeks ago, not sure if it is due to belt on stomach     HPI Patient is in today for asymmetry on lower abdomen which he first noticed approximately 4 wks ago.  Denies pain or discomfort.  No fever, chills, nausea, vomiting or diarrhea.  Denies history of hernia.  States he has gained weight.  HTN-  States he has not taken his BP medication today and had an energy drink on the way here.  He works second shift and so he has not been to bed.  States his blood pressure at home is much lower than today. No headache, dizziness, chest pain, palpitations, shortness of breath, LE edema.  Health Maintenance Due  Topic Date Due   HIV Screening  Never done   Hepatitis C Screening  Never done   TETANUS/TDAP  09/07/2016    Past Medical History:  Diagnosis Date   Internal hemorrhoids     History reviewed. No pertinent surgical history.  Family History  Problem Relation Age of Onset   Cancer Mother    Cancer Father    Depression Father    High blood pressure Father    Arthritis Maternal Grandfather    Cancer Maternal Grandfather    Alcohol abuse Paternal Grandmother    COPD Paternal Grandmother    Mental illness Paternal Grandmother    Alcohol abuse Paternal Grandfather    Heart attack Paternal Grandfather    Heart disease Paternal Grandfather     Social History   Socioeconomic History   Marital status: Single    Spouse name: Not on file   Number of children: Not on file   Years of education: Not on file   Highest education level: Not on file  Occupational History   Not on file  Tobacco Use   Smoking status: Never   Smokeless tobacco: Never  Substance and Sexual Activity   Alcohol use: No    Alcohol/week: 0.0 standard drinks of alcohol   Drug use: No   Sexual activity: Not Currently   Other Topics Concern   Not on file  Social History Narrative   Not on file   Social Determinants of Health   Financial Resource Strain: Not on file  Food Insecurity: Not on file  Transportation Needs: Not on file  Physical Activity: Not on file  Stress: Not on file  Social Connections: Not on file  Intimate Partner Violence: Not on file    Outpatient Medications Prior to Visit  Medication Sig Dispense Refill   buPROPion (WELLBUTRIN XL) 300 MG 24 hr tablet Take 300 mg by mouth daily.     metoprolol succinate (TOPROL-XL) 50 MG 24 hr tablet Take 1 tablet by mouth daily.     sertraline (ZOLOFT) 100 MG tablet Take 100 mg by mouth every morning.     traZODone (DESYREL) 50 MG tablet Take by mouth.     Omega-3 Fatty Acids (FISH OIL) 1000 MG CAPS Take by mouth. (Patient not taking: Reported on 08/29/2022)     No facility-administered medications prior to visit.    No Known Allergies  ROS     Objective:    Physical Exam Constitutional:      General: He is not in acute distress.    Appearance: He is  not ill-appearing.  Cardiovascular:     Rate and Rhythm: Normal rate and regular rhythm.  Pulmonary:     Effort: Pulmonary effort is normal.     Breath sounds: Normal breath sounds.  Abdominal:     General: There is no distension.     Palpations: Abdomen is soft. There is no mass.     Tenderness: There is no abdominal tenderness. There is no guarding or rebound.     Hernia: No hernia is present.     Comments: Mild asymmetry noted to subcutaneous tissue while standing but mainly on the left side.  Skin:    General: Skin is dry.     Findings: No rash.  Neurological:     General: No focal deficit present.     Mental Status: He is alert and oriented to person, place, and time.     Cranial Nerves: No cranial nerve deficit.     Sensory: No sensory deficit.     Motor: No weakness.     Gait: Gait normal.  Psychiatric:        Mood and Affect: Mood normal.        Behavior:  Behavior normal.     BP (!) 138/106 (BP Location: Left Arm, Patient Position: Sitting, Cuff Size: Large)   Pulse 75   Temp 97.6 F (36.4 C) (Temporal)   Ht 5\' 10"  (1.778 m)   Wt 267 lb (121.1 kg)   SpO2 96%   BMI 38.31 kg/m  Wt Readings from Last 3 Encounters:  08/29/22 267 lb (121.1 kg)  03/22/22 260 lb (117.9 kg)  07/05/15 150 lb 9.6 oz (68.3 kg)       Assessment & Plan:   Problem List Items Addressed This Visit       Cardiovascular and Mediastinum   Elevated blood pressure reading in office with diagnosis of hypertension    Discussed my concern regarding his blood pressure being significantly elevated today.  He reports blood pressure at home is much lower.  States he has not taken his medication today and had an energy drink on the way here.  DASH diet handout provided.  Counseling on low-salt, low caffeine diet to help control blood pressure at continue Toprol XL 50 mg daily. Check BMP, TSH, UA to look for complications of elevated BP.   Follow up in 4-6 wks with his home BP readings or sooner if needed.       Relevant Orders   CBC with Differential/Platelet (Completed)   Comprehensive metabolic panel (Completed)   TSH (Completed)   Urinalysis, Routine w reflex microscopic (Completed)     Other   Asymmetry of abdominal wall - Primary    Exam benign. Discussed soft tissue asymmetry but no sign of hernia or anything worrisome. Follow up if he notices any changes or new symptoms.        I have discontinued Alice Rieger. Litle's Fish Oil. I am also having him maintain his traZODone, sertraline, metoprolol succinate, and buPROPion.  No orders of the defined types were placed in this encounter.

## 2022-08-29 NOTE — Assessment & Plan Note (Addendum)
Discussed my concern regarding his blood pressure being significantly elevated today.  He reports blood pressure at home is much lower.  States he has not taken his medication today and had an energy drink on the way here.  DASH diet handout provided.  Counseling on low-salt, low caffeine diet to help control blood pressure at continue Toprol XL 50 mg daily. Check BMP, TSH, UA to look for complications of elevated BP.   Follow up in 4-6 wks with his home BP readings or sooner if needed.

## 2022-08-30 NOTE — Progress Notes (Signed)
Please let him know that his thyroid test (TSH) is just marginally elevated. This can be rechecked at his follow up visit. Normal liver, kidney and electrolytes.

## 2022-08-31 ENCOUNTER — Telehealth: Payer: Self-pay

## 2022-08-31 NOTE — Telephone Encounter (Signed)
Patient called back, gave message below. No questions, verbalized understanding.

## 2022-09-20 ENCOUNTER — Encounter: Payer: Self-pay | Admitting: Family Medicine

## 2022-09-20 ENCOUNTER — Ambulatory Visit (INDEPENDENT_AMBULATORY_CARE_PROVIDER_SITE_OTHER): Payer: Managed Care, Other (non HMO) | Admitting: Family Medicine

## 2022-09-20 VITALS — BP 144/96 | HR 85 | Temp 97.6°F | Ht 70.0 in | Wt 264.0 lb

## 2022-09-20 DIAGNOSIS — J029 Acute pharyngitis, unspecified: Secondary | ICD-10-CM | POA: Diagnosis not present

## 2022-09-20 DIAGNOSIS — R051 Acute cough: Secondary | ICD-10-CM | POA: Diagnosis not present

## 2022-09-20 DIAGNOSIS — J069 Acute upper respiratory infection, unspecified: Secondary | ICD-10-CM

## 2022-09-20 DIAGNOSIS — R0981 Nasal congestion: Secondary | ICD-10-CM | POA: Diagnosis not present

## 2022-09-20 LAB — POCT RAPID STREP A (OFFICE): Rapid Strep A Screen: NEGATIVE

## 2022-09-20 LAB — POC COVID19 BINAXNOW: SARS Coronavirus 2 Ag: NEGATIVE

## 2022-09-20 NOTE — Patient Instructions (Addendum)
Your COVID test is negative Your strep test is also negative   I recommend treating your symptoms.   Mucinex for thick mucus and congestion Flonase nasal spray for congestion Salt water gargles, Chloraseptic spray, throat lozenges and fluids for sore throat.   Tylenol or ibuprofen for pain  Follow-up if you are getting worse or not improving in the next 2 to 3 days.

## 2022-09-20 NOTE — Progress Notes (Signed)
Subjective:  Brett Munoz is a 31 y.o. male who presents for a 4-5 day hx of sneezing, sore throat, nasal congestion, and cough.  Co-worker had Covid infection last week.   Denies fever, chills, chest pain, shortness of breath, abdominal pain, N/V/D.   Treatment to date: ibuprofen and Nyquil.  Benzadrex  inhaler   No other aggravating or relieving factors.  No other c/o.  ROS as in subjective.   Objective: Vitals:   09/20/22 1313  BP: (!) 144/96  Pulse: 85  Temp: 97.6 F (36.4 C)  SpO2: 98%    General appearance: Alert, WD/WN, no distress, mildly ill appearing                             Skin: warm, no rash                           Head: no sinus tenderness                            Eyes: conjunctiva normal, corneas clear, PERRLA                            Ears: pearly TMs, external ear canals normal                          Nose: septum midline, turbinates swollen, moderate erythema and clear discharge             Mouth/throat: MMM, tongue normal, moderate pharyngeal erythema and mild edema.                            Neck: supple, mild anterior cervical adenopathy with TTP, no thyromegaly                          Heart: RRR                         Lungs: CTA bilaterally, no wheezes, rales, or rhonchi      Assessment: Acute URI  Acute cough - Plan: POC COVID-19, POCT rapid strep A  Nasal congestion - Plan: POC COVID-19  Sore throat - Plan: POC COVID-19, POCT rapid strep A    Plan: Covid test is negative  Strep test is negative  Discussed diagnosis and treatment of URI.  Suggested symptomatic OTC remedies. Nasal saline spray and Flonase for congestion.  Tylenol or Ibuprofen OTC for fever and malaise.  Call/return in 2-3 days if symptoms aren't resolving.

## 2022-10-09 ENCOUNTER — Ambulatory Visit (INDEPENDENT_AMBULATORY_CARE_PROVIDER_SITE_OTHER): Payer: Managed Care, Other (non HMO) | Admitting: Internal Medicine

## 2022-10-09 ENCOUNTER — Encounter: Payer: Self-pay | Admitting: Internal Medicine

## 2022-10-09 ENCOUNTER — Other Ambulatory Visit (HOSPITAL_COMMUNITY): Payer: Self-pay

## 2022-10-09 VITALS — BP 150/100 | HR 111 | Temp 98.0°F | Ht 70.0 in | Wt 266.0 lb

## 2022-10-09 DIAGNOSIS — I10 Essential (primary) hypertension: Secondary | ICD-10-CM | POA: Diagnosis not present

## 2022-10-09 DIAGNOSIS — F32A Depression, unspecified: Secondary | ICD-10-CM | POA: Diagnosis not present

## 2022-10-09 DIAGNOSIS — F419 Anxiety disorder, unspecified: Secondary | ICD-10-CM

## 2022-10-09 MED ORDER — METOPROLOL SUCCINATE ER 50 MG PO TB24
50.0000 mg | ORAL_TABLET | Freq: Every day | ORAL | 1 refills | Status: DC
  Filled 2022-10-09: qty 30, 30d supply, fill #0
  Filled 2022-11-16: qty 90, 90d supply, fill #0
  Filled 2023-02-23: qty 30, 30d supply, fill #1
  Filled 2023-02-24: qty 90, 90d supply, fill #1

## 2022-10-09 MED ORDER — BUPROPION HCL ER (XL) 300 MG PO TB24
300.0000 mg | ORAL_TABLET | Freq: Every day | ORAL | 3 refills | Status: DC
  Filled 2022-10-09: qty 30, 30d supply, fill #0
  Filled 2022-12-08 – 2022-12-12 (×2): qty 90, 90d supply, fill #0

## 2022-10-09 MED ORDER — SERTRALINE HCL 50 MG PO TABS
100.0000 mg | ORAL_TABLET | Freq: Every morning | ORAL | 3 refills | Status: DC
  Filled 2022-10-09: qty 60, 30d supply, fill #0
  Filled 2022-11-16: qty 180, 90d supply, fill #0

## 2022-10-09 NOTE — Progress Notes (Unsigned)
   Subjective:   Patient ID: Brett Munoz, male    DOB: 05-Apr-1991, 31 y.o.   MRN: [id number]  HPI The patient is a 31 YO man coming in for follow up BP.   Review of Systems  Constitutional: Negative.   HENT: Negative.    Eyes: Negative.   Respiratory:  Negative for cough, chest tightness and shortness of breath.   Cardiovascular:  Negative for chest pain, palpitations and leg swelling.  Gastrointestinal:  Negative for abdominal distention, abdominal pain, constipation, diarrhea, nausea and vomiting.  Musculoskeletal: Negative.   Skin: Negative.   Neurological: Negative.   Psychiatric/Behavioral: Negative.      Objective:  Physical Exam Constitutional:      Appearance: He is well-developed.  HENT:     Head: Normocephalic and atraumatic.  Cardiovascular:     Rate and Rhythm: Normal rate and regular rhythm.  Pulmonary:     Effort: Pulmonary effort is normal. No respiratory distress.     Breath sounds: Normal breath sounds. No wheezing or rales.  Abdominal:     General: Bowel sounds are normal. There is no distension.     Palpations: Abdomen is soft.     Tenderness: There is no abdominal tenderness. There is no rebound.  Musculoskeletal:     Cervical back: Normal range of motion.  Skin:    General: Skin is warm and dry.  Neurological:     Mental Status: He is alert and oriented to person, place, and time.     Coordination: Coordination normal.     Vitals:   10/09/22 1454 10/09/22 1457  BP: (!) 150/100 (!) 150/100  Pulse: (!) 111   Temp: 98 F (36.7 C)   TempSrc: Oral   SpO2: 97%   Weight: 266 lb (120.7 kg)   Height: 5\' 10"  (1.778 m)     Assessment & Plan:  Visit time 25 minutes in face to face communication with patient and coordination of care, additional 10 minutes spent in record review, coordination or care, ordering tests, communicating/referring to other healthcare professionals, documenting in medical records all on the same day of the visit for total  time 35 minutes spent on the visit.

## 2022-10-09 NOTE — Patient Instructions (Addendum)
I have sent in the refills.   For the zoloft I have sent in 50 mg tablets so that you can reduce to 75 mg daily for 1 month then to 50 mg daily for 1 month then to 25 mg daily for 1 month then stop.  Check the blood pressure 1-2 times a week and let us know if running at or above 140/90

## 2022-10-11 NOTE — Assessment & Plan Note (Signed)
BP is moderately elevated and he has not been exercising and diet has been poor. He is motivated to make a change to diet and lifestyle and follow up 2-3 months on BP. He has a meter at home and will monitor 1-2 times per week and let us know in 1 month readings. Refilled metoprolol 50 mg daily.

## 2022-10-11 NOTE — Assessment & Plan Note (Signed)
He would like to start slow wean of zoloft. We have prescribed 50 mg zoloft to take 100 mg daily (current dosing) and wean by 25 mg per month to wean down or off over 4-5 months. Recommended follow up PCP in 2-3 months.Continue wellbutrin 300 mg daily without change. Both refilled. Rarely takes trazodone any longer.

## 2022-10-18 ENCOUNTER — Other Ambulatory Visit (HOSPITAL_COMMUNITY): Payer: Self-pay

## 2022-11-02 ENCOUNTER — Encounter: Payer: Self-pay | Admitting: Family Medicine

## 2022-11-02 ENCOUNTER — Ambulatory Visit (INDEPENDENT_AMBULATORY_CARE_PROVIDER_SITE_OTHER): Payer: Managed Care, Other (non HMO) | Admitting: Family Medicine

## 2022-11-02 VITALS — BP 116/78 | HR 70 | Temp 97.6°F | Ht 70.0 in | Wt 266.0 lb

## 2022-11-02 DIAGNOSIS — F32A Depression, unspecified: Secondary | ICD-10-CM

## 2022-11-02 DIAGNOSIS — R7989 Other specified abnormal findings of blood chemistry: Secondary | ICD-10-CM

## 2022-11-02 DIAGNOSIS — F1990 Other psychoactive substance use, unspecified, uncomplicated: Secondary | ICD-10-CM

## 2022-11-02 DIAGNOSIS — F4310 Post-traumatic stress disorder, unspecified: Secondary | ICD-10-CM | POA: Diagnosis not present

## 2022-11-02 DIAGNOSIS — F1011 Alcohol abuse, in remission: Secondary | ICD-10-CM

## 2022-11-02 DIAGNOSIS — R748 Abnormal levels of other serum enzymes: Secondary | ICD-10-CM

## 2022-11-02 LAB — TSH: TSH: 1.89 u[IU]/mL (ref 0.35–5.50)

## 2022-11-02 LAB — T3, FREE: T3, Free: 3.6 pg/mL (ref 2.3–4.2)

## 2022-11-02 LAB — COMPREHENSIVE METABOLIC PANEL
ALT: 35 U/L (ref 0–53)
AST: 21 U/L (ref 0–37)
Albumin: 4.3 g/dL (ref 3.5–5.2)
Alkaline Phosphatase: 95 U/L (ref 39–117)
BUN: 13 mg/dL (ref 6–23)
CO2: 29 mEq/L (ref 19–32)
Calcium: 9.6 mg/dL (ref 8.4–10.5)
Chloride: 103 mEq/L (ref 96–112)
Creatinine, Ser: 0.99 mg/dL (ref 0.40–1.50)
GFR: 103.16 mL/min (ref >60.00)
Glucose, Bld: 111 mg/dL — ABNORMAL HIGH (ref 70–99)
Potassium: 4.5 mEq/L (ref 3.5–5.1)
Sodium: 137 mEq/L (ref 135–145)
Total Bilirubin: 0.5 mg/dL (ref 0.2–1.2)
Total Protein: 7.3 g/dL (ref 6.0–8.3)

## 2022-11-02 LAB — T4, FREE: Free T4: 0.89 ng/dL (ref 0.60–1.60)

## 2022-11-02 NOTE — Progress Notes (Signed)
Subjective:     Patient ID: Brett Munoz, male    DOB: May 15, 1991, 31 y.o.   MRN: [id number]  Chief Complaint  Patient presents with   Follow-up    [facility] needs liver function testing    HPI Patient is in today for follow up for abnormal liver and kidney function while in [facility] receiving detox from polysubstance abuse.   12 days sober.   Is not taking Wellbutrin. States he is taking naltrexone. Does not need me to prescribe this.   Feels depressed. Denies SI.   Denies fever, chills, dizziness, chest pain, palpitations, shortness of breath, abdominal pain, N/V/D, urinary symptoms, LE edema.       11/02/2022    1:34 PM 10/09/2022    3:00 PM 03/22/2022   10:17 AM 07/05/2015    2:39 PM  Depression screen PHQ 2/9  Decreased Interest 1 1 0 0  Down, Depressed, Hopeless 2 0 0 0  PHQ - 2 Score 3 1 0 0  Altered sleeping 0 1    Tired, decreased energy 1 1    Change in appetite 0 0    Feeling bad or failure about yourself  1 0    Trouble concentrating 0 0    Moving slowly or fidgety/restless 0 0    Suicidal thoughts 0 0    PHQ-9 Score 5 3    Difficult doing work/chores Not difficult at all Not difficult at all       Health Maintenance Due  Topic Date Due   DTaP/Tdap/Td (2 - Tdap) 09/07/2016    Past Medical History:  Diagnosis Date   Internal hemorrhoids     History reviewed. No pertinent surgical history.  Family History  Problem Relation Age of Onset   Cancer Mother    Cancer Father    Depression Father    High blood pressure Father    Arthritis Maternal Grandfather    Cancer Maternal Grandfather    Alcohol abuse Paternal Grandmother    COPD Paternal Grandmother    Mental illness Paternal Grandmother    Alcohol abuse Paternal Grandfather    Heart attack Paternal Grandfather    Heart disease Paternal Grandfather     Social History   Socioeconomic History   Marital status: Single    Spouse name: Not on file    Number of children: Not on file   Years of education: Not on file   Highest education level: Not on file  Occupational History   Not on file  Tobacco Use   Smoking status: Never   Smokeless tobacco: Never  Substance and Sexual Activity   Alcohol use: No    Alcohol/week: 0.0 standard drinks of alcohol   Drug use: No   Sexual activity: Not Currently  Other Topics Concern   Not on file  Social History Narrative   Not on file   Social Determinants of Health   Financial Resource Strain: Not on file  Food Insecurity: Not on file  Transportation Needs: Not on file  Physical Activity: Not on file  Stress: Not on file  Social Connections: Not on file  Intimate Partner Violence: Not on file    Outpatient Medications Prior to Visit  Medication Sig Dispense Refill   metoprolol succinate (TOPROL-XL) 50 MG 24 hr tablet Take 1 tablet (50 mg total) by mouth daily. 90 tablet 1   naltrexone (DEPADE) 50 MG tablet Take by mouth daily.     sertraline (ZOLOFT) 50  MG tablet Take 2 tablets (100 mg total) by mouth every morning. 180 tablet 3   traZODone (DESYREL) 50 MG tablet Take by mouth.     buPROPion (WELLBUTRIN XL) 300 MG 24 hr tablet Take 1 tablet (300 mg total) by mouth daily. (Patient not taking: Reported on 11/02/2022) 90 tablet 3   No facility-administered medications prior to visit.    No Known Allergies  ROS     Objective:    Physical Exam Constitutional:      General: He is not in acute distress.    Appearance: He is not ill-appearing.  Cardiovascular:     Rate and Rhythm: Normal rate.  Pulmonary:     Effort: Pulmonary effort is normal.  Skin:    General: Skin is warm and dry.  Neurological:     General: No focal deficit present.     Mental Status: He is alert and oriented to person, place, and time.  Psychiatric:        Attention and Perception: Attention normal.        Mood and Affect: Mood normal.        Speech: Speech normal.        Behavior: Behavior normal.         Thought Content: Thought content does not include suicidal ideation.     BP 116/78 (BP Location: Left Arm, Patient Position: Sitting, Cuff Size: Large)   Pulse 70   Temp 97.6 F (36.4 C) (Temporal)   Ht 5\' 10"  (1.778 m)   Wt 266 lb (120.7 kg)   SpO2 97%   BMI 38.17 kg/m  Wt Readings from Last 3 Encounters:  11/02/22 266 lb (120.7 kg)  10/09/22 266 lb (120.7 kg)  09/20/22 264 lb (119.7 kg)       Assessment & Plan:   Problem List Items Addressed This Visit   None Visit Diagnoses     Abnormal liver enzymes    -  Primary   Relevant Orders   Comprehensive metabolic panel   Elevated serum creatinine       Relevant Orders   Comprehensive metabolic panel   PTSD (post-traumatic stress disorder)       Relevant Orders   Ambulatory referral to Psychiatry   Depression, unspecified depression type       Relevant Orders   Ambulatory referral to Psychiatry   Polysubstance use disorder       Relevant Orders   Ambulatory referral to Psychiatry   Alcohol abuse, in remission       Relevant Orders   Ambulatory referral to Psychiatry   Elevated TSH       Relevant Orders   TSH   T3, free   T4, free      Congratulated him on being sober.  Recheck kidney and liver function today due to elevation well in detox.  I do not have these records on file. His TSH was also elevated at his previous visit with me.  I will recheck thyroid panel. He is not currently involved with a mental health specialist.  He is going to AA.  Referral to psychiatry for mental health and he is willing to schedule a visit. Follow-up here in 6 weeks or sooner if needed.  I am having 13/08/23. Laible maintain his traZODone, metoprolol succinate, buPROPion, sertraline, and naltrexone.  No orders of the defined types were placed in this encounter.

## 2022-11-02 NOTE — Patient Instructions (Signed)
Please go downstairs for labs.   I placed a referral to psychiatry. They will call you to schedule a visit.   We will be in touch with your lab results and recommendations.

## 2022-11-16 ENCOUNTER — Other Ambulatory Visit (HOSPITAL_BASED_OUTPATIENT_CLINIC_OR_DEPARTMENT_OTHER): Payer: Self-pay

## 2022-11-27 ENCOUNTER — Ambulatory Visit (INDEPENDENT_AMBULATORY_CARE_PROVIDER_SITE_OTHER): Payer: 59 | Admitting: Clinical

## 2022-11-27 DIAGNOSIS — F32A Depression, unspecified: Secondary | ICD-10-CM

## 2022-11-27 DIAGNOSIS — F1090 Alcohol use, unspecified, uncomplicated: Secondary | ICD-10-CM | POA: Diagnosis not present

## 2022-11-27 DIAGNOSIS — F19922 Other psychoactive substance use, unspecified with intoxication with perceptual disturbance: Secondary | ICD-10-CM | POA: Diagnosis not present

## 2022-11-27 DIAGNOSIS — F1910 Other psychoactive substance abuse, uncomplicated: Secondary | ICD-10-CM

## 2022-11-27 DIAGNOSIS — F19950 Other psychoactive substance use, unspecified with psychoactive substance-induced psychotic disorder with delusions: Secondary | ICD-10-CM

## 2022-11-28 ENCOUNTER — Encounter (HOSPITAL_COMMUNITY): Payer: Self-pay | Admitting: Clinical

## 2022-11-28 ENCOUNTER — Encounter (HOSPITAL_COMMUNITY): Payer: Self-pay
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
## 2022-12-08 ENCOUNTER — Other Ambulatory Visit (HOSPITAL_COMMUNITY): Payer: Self-pay

## 2022-12-11 ENCOUNTER — Ambulatory Visit (INDEPENDENT_AMBULATORY_CARE_PROVIDER_SITE_OTHER): Payer: 59 | Admitting: Clinical

## 2022-12-11 ENCOUNTER — Encounter (HOSPITAL_COMMUNITY): Payer: Self-pay | Admitting: Clinical

## 2022-12-11 DIAGNOSIS — F339 Major depressive disorder, recurrent, unspecified: Secondary | ICD-10-CM

## 2022-12-11 DIAGNOSIS — F1020 Alcohol dependence, uncomplicated: Secondary | ICD-10-CM

## 2022-12-11 DIAGNOSIS — F419 Anxiety disorder, unspecified: Secondary | ICD-10-CM | POA: Diagnosis not present

## 2022-12-11 NOTE — Progress Notes (Signed)
THERAPIST PROGRESS NOTE  Session Time: 3:10-4:00pm  Participation Level: Minimal  Behavioral Response: Neat and Well Groomed Lethargic  Disconnected  Type of Therapy: Individual Therapy  Treatment Goals addressed:  LTG PHQ score will be reduced from current score of 12 to no more than 7  STG will learn about symptoms of depression and be able to verbalize 5 ways in which it is likely that his depression contributes to his substance abuse problems LTG: Brett Munoz will improve quality of life by maintaining ongoing abstinence from all mood-altering substances  STG: Brett Munoz will identify 3 personal recovery goals   ProgressTowards Goals: Not Progressing  Interventions: CBT and Motivational Interviewing  Summary: Brett Munoz is a 32 y.o. male who presents with depression, anxiety, and severe alcohol use disorder.  He reported as soon as he entered the room that he has not been doing well with his drinking and that his life is not going "that great" due to being fed up at work.  He reports that his depression is active although he notes that he can function and is not tearful.  He continues to take Zoloft 50 mg daily and Wellbutrin XR 300 mg daily as prescribed by his Primary Care Physician, but has not taken the Naltrexone prescribed to him in about 2 weeks.  His appetite has decreased "because of liquid calories" and because he is often waking up hung over or still intoxicated.  He reports that he drank a seltzer in the early morning hours today in order to sleep but in general he knows that when he drinks his sleep is much worse.  He did just get a new mattress and is hopeful that will help with sleep.  He states that he called out of work 2 days ago because he woke up intoxicated.  A "light" day of drinking would be consumption of 4 drinks or less and he can succeed at times in keeping his intake lower by not purchasing large containers of alcohol.  He states that today is the first day he has  showered in 6 days and that it made him feel sufficiently better that he slept well for a couple of hours afterward.    He suggests that his agenda for this session includes to discuss reducing how much he is drinking and to stop snapping at people in irritation so often. He kept saying throughout the session that he did not feel well and that he did not have a lot to talk about.  He did make every effort to respond to therapist's questions.  By the end of the session, the patient was still in the thinking mode that cutting back on his alcohol consumption is needed, not only to avoid withdrawal symptoms (although he expressed doubt that he would have any, stating he has only been drinking 2 weeks) but that he can manage it.  He shared that going through the Decisional Balance Exercise gave him more of a "sense of urgency about quitting."  Suicidal/Homicidal: No without intent/plan  Therapist Response: Patient was anxious and on edge throughout the session, stated several times he did not have much to talk about.  He reported that when he woke up today, he thought anxiously that he had to deal with seeing his therapist today, then going to his parents' house for dinner.  When taken through the process of "what would be the worst thing that could happen" with each of those scenarios, he was aware that he was predicting the  worst and that this is a cognitive distortion.  He also stated he was anxious about looking at the Treatment Plan because he did not know what therapist would have written about him.  He did look at it and stated it sounded reasonable, had no suggestions/requests for changes.  Therapist led him through a Decisional Balance Exercise regarding his drinking behaviors.  While he listed many more costs of drinking than benefits, he remains convinced that what he needs to do is to cut back.  He was able to self-identify from past inpatient stays that he is in the  Contemplation Stage of Change.   Because he expressed that he does not believe he is in danger of withdrawal since he has only been drinking for 2 weeks, therapist asked him to list symptoms that would indicate to him he needed to get medical care.  He was thorough and accurate with this.  He was given the original Decisional Balance Exercise done together along with a clean copy with written explanation of how to complete it to take home.  He also requested a copy of "Thinking Traps," i.e. cognitive distortions.  Because he expressed considerable worry about not being able to eat dinner due to stomach upset (from alcohol) when he went to his parents' home after therapy session, we discussed options of what he could tell them.  He further expressed that his mother is going to "guilt" him some more and start nagging him about getting other furniture now that he has a mattress, and therapist went over the idea of "I" statements with him.  He was anxious to leave the session and stated he feels more of an urgency about quitting after the session today.  Plan: Return again in 2 weeks  Diagnosis:  Alcohol use disorder, severe, dependence (HCC) (F10.20) Anxiety disorder, unspecified type (F41.9) Episode of recurrent major depressive disorder, unspecified depression episode severity ([place]) (F33.9)  Collaboration of Care: Primary Care Provider AEB has access to therapy notes in [place], knows from previous communication that patient is drinking, is concerned  Patient/Guardian was advised Release of Information must be obtained prior to any record release in order to collaborate their care with an outside provider. Patient/Guardian was advised if they have not already done so to contact the registration department to sign all necessary forms in order for [place] to release information regarding their care.   Consent: Patient/Guardian gives verbal consent for treatment and assignment of benefits for services provided during this visit. Patient/Guardian  expressed understanding and agreed to proceed.   Recommendations:  Return to therapy in 2 weeks, engage in self care behaviors, plan positive social engagements, focus on overall work/home life balance, implement 1 new coping skill as taught in session, and return to next session prepared to talk about experience with that new coping method.   Maretta Los, LCSW 12/11/2022

## 2022-12-12 ENCOUNTER — Other Ambulatory Visit (HOSPITAL_BASED_OUTPATIENT_CLINIC_OR_DEPARTMENT_OTHER): Payer: Self-pay

## 2022-12-13 ENCOUNTER — Other Ambulatory Visit (HOSPITAL_COMMUNITY): Payer: Self-pay

## 2022-12-15 ENCOUNTER — Ambulatory Visit: Payer: Managed Care, Other (non HMO) | Admitting: Family Medicine

## 2022-12-25 ENCOUNTER — Encounter (HOSPITAL_COMMUNITY): Payer: Self-pay | Admitting: Clinical

## 2022-12-25 ENCOUNTER — Ambulatory Visit (INDEPENDENT_AMBULATORY_CARE_PROVIDER_SITE_OTHER): Payer: 59 | Admitting: Clinical

## 2022-12-25 DIAGNOSIS — F419 Anxiety disorder, unspecified: Secondary | ICD-10-CM

## 2022-12-25 DIAGNOSIS — F339 Major depressive disorder, recurrent, unspecified: Secondary | ICD-10-CM | POA: Diagnosis not present

## 2022-12-25 DIAGNOSIS — F1020 Alcohol dependence, uncomplicated: Secondary | ICD-10-CM | POA: Diagnosis not present

## 2022-12-25 NOTE — Progress Notes (Signed)
THERAPIST PROGRESS NOTE  Session Time: 3:10-4:10pm  Participation Level: Active  Behavioral Response: Neat and Well Groomed Alert  Type of Therapy: Individual Therapy  Treatment Goals addressed:  LTG PHQ score will be reduced from current score of 12 to no more than 7  STG will learn about symptoms of depression and be able to verbalize 5 ways in which it is likely that his depression contributes to his substance abuse problems LTG: Brett Munoz will improve quality of life by maintaining ongoing abstinence from all mood-altering substances  STG: Brett Munoz will identify 3 personal recovery goals   ProgressTowards Goals: Progressing  Interventions: CBT and Motivational Interviewing  Summary: Brett Munoz is a 32 y.o. male who presents with depression, anxiety, and severe alcohol use disorder.  In contrast to last week, he was jovial and excited about coming to the session.  He stated he is not ready to stop drinking alcohol at this point and shared that he is typically drinking about 4-8 drinks a night, usually a combination of beer and liquor.  He said he drinks because it is fun and otherwise he does not find life to be very exciting.  He could not identify many down sides at the moment to drinking, although he acknowledged that he cannot continue for a lifetime without sustaining physical damage.  He smoked marijuana a couple of days ago and it started to make him paranoid so he stopped.  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  We discussed the role of acceptance in easing suffering as well as the role of non-acceptance in increasing pain to the point of suffering.  He had loose associations throughout the session and was difficult to keep on topic.  He provided an update on how things have been going with his parents, including his visit after last session that he  had dreaded.  He did conclude that he had expected the worst and it did not come to pass.  He shared that he does continue to have ongoing resentment toward his father from even just a few years ago when his father predicted a friend would kick him out when he went to stay with the friend, which in fact happened, after which father gloated about being right.  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  He remains resentful that they placed him into a disreputable outpatient program for his alcohol/marijuana use that he attended for 1-1/2 years that was detrimental to his mental health and started his nicotine habit.  He did not appear to be interested in talking with his parents about any of these past offenses in order to improve their current relationships.  He does not believe they know about his current drinking and that is enough for him to feel that they are at a reasonable place right now in their relationship.  Patient did meet some new friends on-line and went to hang out with them recently, stated they were just a group of gay guys like himself with some similar interests.  He almost did not go, but told himself that if he went to socialize with some people, he could brag to his therapist about it.  He is excited because an ex-boyfriend is coming to visit him and they have remained friends.  This individual also drinks.    Patient reported that he had a performance appraisal at work that was very [employer], with his supervisor sharing that he can meet his productivity with paying a little more attention.  He stated he is not as unhappy at work as he was previously, that he has decided to try to meet his quota.  He is listening to history podcasts in order to maintain interest.  Patient also  shared that he is weaning himself off Zoloft because he does not like the possibility of having brain zaps if he misses a dose.  He is not taking the naltrexone so that he can drink.  He did not share, and was not asked, whether he continues to take the Wellbutrin.  He continued to maintain that he drinks alcohol because it is fun not because he is self-medicating.  He said that he recently heard someone say that it costs $40 for a meth high-ball in [place], [place] and he has fantasized about moving there and just getting high and staying that way, saying that it must be fun if people do it.  When therapist shared the experience of many patients in the hospital having a different view, he neither agreed nor disagreed.  He said, however, that there is no other point to life anyway, because we are all going to die.  Therapist asked if he thought he would get to the end of his life and regret not becoming a meth addict and he could not respond.  He desired to make more appointments to keep coming to therapy.  Suicidal/Homicidal: No   Therapist Response: Therapist worked to follow the patient's train of thought as it was very loose.  Throughout the session, MI was woven in to highlight his ambivalence about sobriety.  MI was also used to highlight any ambivalence about his relationship with parents and taking the opportunity to work through some past issues in order to improve things with them, with result that he does not feel there is a lot of need to do that.  "I" statements had been discussed last session and thus he was reminded of how helpful this could be in discussing the past problems with his parents, but he was not committed to trying any of this.  Support and encouragement were given as he was reminded that establishing more social connections was one of his goals that he worked on since last session.  It should be noted that patient's intellectualization makes it difficult to feel like progress is  being made.  Future sessions could be focused in a different manner, perhaps, to reach the patient's actual emotional state that is prompting him to drink so heavily.  Plan: Return again in 2 weeks  Diagnosis:  Alcohol use disorder, severe, dependence (F10.20)  Anxiety disorder, unspecified type (F41.9)  Episode of recurrent major depressive disorder, unspecified depression episode severity  (F33.9)   Collaboration of Care: Primary Care Provider AEB has access to notes in Epic although he cancelled his appointment with doctor and is weaning himself off the medication she prescribed.  Patient/Guardian was advised Release of Information must be obtained prior to any record release in order to collaborate their care with an outside provider. Patient/Guardian was advised if they have not already done so to contact the registration department to sign all necessary forms in order for [place] to release information regarding their  care.   Consent: Patient/Guardian gives verbal consent for treatment and assignment of benefits for services provided during this visit. Patient/Guardian expressed understanding and agreed to proceed.   Recommendations:  Return to therapy in 2 weeks, engage in self care behaviors, plan positive social engagements, focus on overall work/home life balance, continue to watch for any signs of withdrawal symptoms that may need medical attention, continue to consider any reasons for and against sobriety   Maretta Los, LCSW 12/25/2022

## 2023-01-15 ENCOUNTER — Ambulatory Visit (INDEPENDENT_AMBULATORY_CARE_PROVIDER_SITE_OTHER): Payer: 59 | Admitting: Clinical

## 2023-01-15 DIAGNOSIS — F1020 Alcohol dependence, uncomplicated: Secondary | ICD-10-CM

## 2023-01-15 DIAGNOSIS — F339 Major depressive disorder, recurrent, unspecified: Secondary | ICD-10-CM | POA: Diagnosis not present

## 2023-01-15 DIAGNOSIS — F419 Anxiety disorder, unspecified: Secondary | ICD-10-CM

## 2023-01-15 NOTE — Progress Notes (Unsigned)
THERAPIST PROGRESS NOTE  Session Time: 2:15-3:02pm  Participation Level: Active  Behavioral Response: Fairly Groomed and Neat Alert Negative and Pessimistic and Uninterested  Type of Therapy: Individual Therapy  Treatment Goals addressed:  LTG PHQ score will be reduced from current score of 12 to no more than 7  STG will learn about symptoms of depression and be able to verbalize 5 ways in which it is likely that his depression contributes to his substance abuse problems  LTG: Adnaan will improve quality of life by maintaining ongoing abstinence from all mood-altering substances  STG: Ezell will identify 3 personal recovery goals   ProgressTowards Goals: Not Progressing  Interventions: Motivational Interviewing and Supportive  Summary: Brett Munoz is a 32 y.o. male who presents with depression, anxiety, and severe alcohol use disorder.  He said he was late because he could not get motivated to get out of bed.  He also said he had nothing really on his mind to discuss.  At the last session, CSW noted that "Future sessions could be focused in a different manner, perhaps, to reach the patient's actual emotional state that is prompting him to drink so heavily."  Patient was very disengaged in this session, asking several times if CSW just wanted to focus on his drinking when questions were asked about that subject.  He kept bringing up his drinking, however, in context of talking about his life.  He stated he has been working hard at work to meet his quotas but he did have to leave yesterday early because he was "careless" and spilled a specimen of urine on himself.  He said this was likely because he had been drinking the night before and was not sufficiently focused.  He related this to past incidents where other situations at work were caused by the morning-after side-effects.  He stated he had added a little marijuana smoking to his nighttime regiment in an effort to go to sleep and this  has  been somewhat successful.  He is taking Kratom prior to work for the pain in his foot and does not believe there is any potential harm from it.  He also stated he thinks maybe he needs to cut back on his drinking because of the calories.  While he has not gained weight, he feels he is mostly eating reasonably and also would benefit by actually losing some weight.  He also shared that he thinks he would have more energy if he cut back on his drinking.  As he motioned to the door at one point, indicating he would leave if CSW kept talking about the drinking, he said he does not feel he could stop so there is no point in trying.    The 1-year anniversary of his friend's suicide is coming up, but he is hesitant to go to any gathering held in her memory because it might be inappropriate since he was using with her just the day before, since her husband has other things going on, and so forth.  He did not react at all to the suggestion that it could be beneficial and healing for him.  He did state he might reach out to other friends in that area to see what they think.  His ex-boyfriend did come for a 2-day visit which was fun and it made him realize he wants to have a close, intimate relationship again, so he thinks he is ready to look for a relationship.  When asked if he is in  the right space for this, he did not feel his drug or alcohol use would be a detriment to a relationship.    He suggested that he is thinking about starting to take stimulants again, if he feels he can control it, although he acknowledged that in the past he could not control his stimulant use and it really ended up badly for him.  He said he could be much more productive at his job if he was taking stimulants.  Drugs make life more interesting in his view, and otherwise he feels that it is fairly boring.  Ultimately he does not intend to go looking for it but if it was to be offered, he is pretty sure he would accept it.  He continues  to wean himself off his anti-depressant and talk about being anxious rather than depressed.  Suicidal/Homicidal: No   Therapist Response: Therapist called patient when he was late to the session, was informed he was 5 minutes away.  He came in talking about not really having anything to talk about.  Therapist attempted to use MI to address his current Stage of Change which is Contemplation, but just barely.  More than once, he brought the discussion back around to his drinking, but he appeared angry at [place] talking repeatedly about that, saying we "just go around in circles and get nowhere."  He has started also using marijuana although he has stopped hanging around with the person he got it from, given that person's arrest and his subsequent belief that maybe that person is not a great friend.  He is also using Kratom several days a week for foot pain.  He kept his eyes closed for much of the session, although he did not appear tired.  It was more like he was disinterested and restless to leave.  He stated he would try to come next time with something to talk about.  CSW told him that he is not responsible for "entertainment" but can actually use the time as a quiet space if he wants.  He was eager to leave and did so.  Recommendations:  Return to therapy in 2 weeks, engage in self care behaviors, plan positive social engagements, focus on overall work/home life balance, continue to watch for any signs of withdrawal symptoms that may need medical attention, continue to consider any reasons for and against sobriety  Plan: Return again in 2-3 weeks  Diagnosis:  Name Primary?   Alcohol use disorder, severe, dependence ([place]) (F10.20) Yes   Episode of recurrent major depressive disorder, unspecified depression episode severity ([place]) (F33.9)    Anxiety disorder, unspecified type (F41.9)      Collaboration of Care: Primary Care Provider AEB has access to notes in Epic although he cancelled his appointment  with doctor and is weaning himself off the medication she prescribed.  Patient/Guardian was advised Release of Information must be obtained prior to any record release in order to collaborate their care with an outside provider. Patient/Guardian was advised if they have not already done so to contact the registration department to sign all necessary forms in order for [place] to release information regarding their care.   Consent: Patient/Guardian gives verbal consent for treatment and assignment of benefits for services provided during this visit. Patient/Guardian expressed understanding and agreed to proceed.    Maretta Los, LCSW 01/15/2023

## 2023-01-16 ENCOUNTER — Encounter (HOSPITAL_COMMUNITY): Payer: Self-pay | Admitting: Clinical

## 2023-02-05 ENCOUNTER — Ambulatory Visit (INDEPENDENT_AMBULATORY_CARE_PROVIDER_SITE_OTHER): Payer: 59 | Admitting: Clinical

## 2023-02-05 ENCOUNTER — Encounter (HOSPITAL_COMMUNITY): Payer: Self-pay | Admitting: Clinical

## 2023-02-05 DIAGNOSIS — F1090 Alcohol use, unspecified, uncomplicated: Secondary | ICD-10-CM

## 2023-02-05 DIAGNOSIS — F1910 Other psychoactive substance abuse, uncomplicated: Secondary | ICD-10-CM

## 2023-02-05 DIAGNOSIS — F339 Major depressive disorder, recurrent, unspecified: Secondary | ICD-10-CM | POA: Diagnosis not present

## 2023-02-05 DIAGNOSIS — F1020 Alcohol dependence, uncomplicated: Secondary | ICD-10-CM | POA: Diagnosis not present

## 2023-02-05 NOTE — Progress Notes (Unsigned)
THERAPIST PROGRESS NOTE  Session Time: 3:05-4:05pm  Session #5  Participation Level: Active  Behavioral Response: Casual and Neat Alert Slightly Depressed   Type of Therapy: Individual Therapy  Treatment Goals addressed:  LTG PHQ score will be reduced from current score of 12 to no more than 7  STG will learn about symptoms of depression and be able to verbalize 5 ways in which it is likely that his depression contributes to his substance abuse problems  LTG: Brett Munoz will improve quality of life by maintaining ongoing abstinence from all mood-altering substances  STG: Brett Munoz will identify 3 personal recovery goals   ProgressTowards Goals: Progressing  Interventions: CBT, Motivational Interviewing, and Supportive  Summary: Brett Munoz is a 32 y.o. male who presents with depression, anxiety, and severe alcohol use disorder.  He states that he was feeling "alright" and that "the weather helps."  He reported sleeping well most nights, averaging 7-9 hours.  He feels he is not eating unnecessarily as much as he was doing although he acknowledges his weight gain.  He is being more social, hanging out with a new guy he met on-line although he thinks this person is more interested in him than vice versa.  He stated he is in the process of starting a balcony garden and has had his banjo restrung so he can play again.  He also stated he has cut back on his alcohol consumption and now is drinking the equivalent of 4-8 drinks nightly.  He did admit he is having cravings for stimulants and feels that if he ever started those again, he would go down the same path of overusing them that occurred previously, that he could not control his use.  He stated there is a furry convention coming up in [place] that his friends have invited him to and even though the last time he went, he dropped acid and became psychotic, he is likely to attend the convention.  He fully acknowledged that the likelihood is huge that  he would partake in drugs while there.    He stated the reason for consuming less alcohol is that he is tired of feeling bad when he wakes up in the morning and is tired of not sleeping well because of alcohol.  He called himself an "unlovable adict" and said he has used that phrase on himself for years.  The downward arrow technique was used to address the use of this term, and he stated that he eels he is "fundamentally broken" and that "a part of me is missing."  He stated that he feels he is meaner, more judgmental about others, and does not like people in a way that is not thought of well socially.  He said as a result of all this, he essentially believes that "nothing will work out."  He is open to the idea that he could prove himself wrong, however.  He stated his life is boring and lonely.  He also talked about liking to help others, a complete opposition to the thoughts expressed earlier, and said that he thinks he tries "to fix others to fix myself."  Patient continues to take less Wellbutrin, does not feel it does anything for his mood at all.  He has heard of a new antidepressant that combines DXM with buproprion, so he is considering taking this "as a home brew."  CSW dissuaded him from doing this, used MI to explore whether he would agree to see a psychiatric provider in lieu of  PCP who has not offered increased dosages or other trials.  He was ultimately agreeable and this was scheduled.    Suicidal/Homicidal: No   Therapist Response: Patient seemed for the first time in awhile to be making progress.  Therapist provided mood monitoring and treatment progress review in the context of this episode of treatment.   Patient reported that his mood has been "improved because of the nice weather".   Patient was able to explore how getting out, even for short periods of time, has a positive impact on his mood.    LCSW gave patient time to explore thoughts and feelings associated with life situations  and external stressors.   LCSW encouraged patient's expression of feelings and validated patient's thoughts, using empathic listening and unconditional positive regard.  Patient was oriented to time, place and situation.    Recommendations:  Return to therapy in 2 weeks, engage in self care behaviors, plan positive social engagements, make psychiatric appointment  Plan: Return again in 2-3 weeks  Diagnosis:  Encounter Diagnoses  Name Primary?   Alcohol use disorder, severe, dependence ([place])    Episode of recurrent major depressive disorder, unspecified depression episode severity ([place]) Yes   Alcohol use disorder    Substance abuse, binge pattern ([place])      Collaboration of Care:  Psychiatrist AEB referral to see psychiatric provider rather than PCP to discuss medication options, rather than "home-brewing" by mixing a cough syrup with his current Wellbutrin medicine  Patient/Guardian was advised Release of Information must be obtained prior to any record release in order to collaborate their care with an outside provider. Patient/Guardian was advised if they have not already done so to contact the registration department to sign all necessary forms in order for [place] to release information regarding their care.   Consent: Patient/Guardian gives verbal consent for treatment and assignment of benefits for services provided during this visit. Patient/Guardian expressed understanding and agreed to proceed.    Maretta Los, LCSW 02/05/2023

## 2023-02-14 ENCOUNTER — Telehealth (HOSPITAL_COMMUNITY): Payer: 59 | Admitting: Psychiatry

## 2023-02-19 ENCOUNTER — Ambulatory Visit (HOSPITAL_COMMUNITY): Payer: 59 | Admitting: Clinical

## 2023-02-23 ENCOUNTER — Other Ambulatory Visit (HOSPITAL_BASED_OUTPATIENT_CLINIC_OR_DEPARTMENT_OTHER): Payer: Self-pay

## 2023-02-24 ENCOUNTER — Other Ambulatory Visit (HOSPITAL_BASED_OUTPATIENT_CLINIC_OR_DEPARTMENT_OTHER): Payer: Self-pay

## 2023-03-05 ENCOUNTER — Ambulatory Visit (INDEPENDENT_AMBULATORY_CARE_PROVIDER_SITE_OTHER): Payer: 59 | Admitting: Clinical

## 2023-03-05 ENCOUNTER — Encounter (HOSPITAL_COMMUNITY): Payer: Self-pay | Admitting: Clinical

## 2023-03-05 DIAGNOSIS — F1020 Alcohol dependence, uncomplicated: Secondary | ICD-10-CM | POA: Diagnosis not present

## 2023-03-05 DIAGNOSIS — F339 Major depressive disorder, recurrent, unspecified: Secondary | ICD-10-CM | POA: Diagnosis not present

## 2023-03-05 NOTE — Progress Notes (Addendum)
THERAPIST PROGRESS NOTE  Session Time: 3:14-3:44pm  Session #6  Participation Level: Active  Behavioral Response: Casual and Neat Alert Slightly Depressed   Type of Therapy: Individual Therapy  Treatment Goals addressed:  LTG PHQ score will be reduced from current score of 12 to no more than 7  STG will learn about symptoms of depression and be able to verbalize 5 ways in which it is likely that his depression contributes to his substance abuse problems  LTG: Brett Munoz will improve quality of life by maintaining ongoing abstinence from all mood-altering substances  STG: Brett Munoz will identify 3 personal recovery goals   ProgressTowards Goals: Not Progressing  Interventions: CBT and Supportive  Summary: Brett Munoz is a 32 y.o. male who presents with depression, anxiety, and severe alcohol use disorder.  He apologized for being late, responded with short answers to questions, stating he is "doing good, he is taking the Wellbutrin regularly, and in terms of efficacy, feels it "is okay, but I don't think it is doing anything."  He looks forward to a trip to [place] in about 3 weeks for a convention.  This is the trip that he has previously referred to where he anticipates doing a variety of drugs.  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  He reported that he has not called out from work since they talked to him about improving his performance 2 months ago; however, he does leave as soon as his work is done.  Therefore, he is not accruing any Paid Time Off.  He had previously gone into the red on PTO because of the one week off for detox and other times he had called out.  He stated he did not come in with a topic to talk about, had only come to the appointment because it was the polite thing to do.  He reported that he continues to drink 8-10 shots of vodka  daily although if he wants a light drinking day, he will switch this to 4-6 beers.  He stated he does want to lose weight because he thinks he would feel better.  He shaved his beard while drunk recently and found that he no longer has a jaw line due to the extra weight in his face.  CSW pointed out that he is consuming about [phone] calories daily in alcohol and he stated that when he does this, he skips a meal.  He also reported he is continuing to use Kratum for the pain in his ankle and he does not even notice any psychoactive effects from it.  He is seeing his parents once every week or two, will see them following this appointment.  His mother helps with his laundry which he thinks probably means he is immature, but she wants to do it.  CSW asked what emotion he would like to have in his life that he does not currently have, and he replied "peace or serenity."  He is enjoying the plants he is growing on his balcony and says that is the type of feeling he wants.  We discussed his first stay at [facility] which is based on the 12-step program.  He did not want to stay the entire time for it the second stay because he believed he had already heard it and did not want to hear  it again.  CSW suggested he might hear things differently.  CSW talked briefly about how our thoughts affect our feelings, but our behaviors can do the same thing.  When CSW asked if he could think of things he has done in the past which made him feel good, he abruptly stated he did not come in with anything to talk about and was ready to leave. He then left quickly.  Suicidal/Homicidal: No   Therapist Response: Patient is back to where he has been several times before, drinking at the heavier amount that he has tended to get to previously.  He does not have motivation to change at this point.  He is not invested in therapy.  CSW has not found subjects outside of his substance abuse that he is willing to talk about, but he gets  angry and offended when the topic is his substance abuse because he often brings it up.  He did not keep the appointment he set with the psychiatrist despite the decisions made together during last appointment.  He is not vested in treatment.  Motivational Interviewing will be necessary and helpful in the future to determine whether to continue therapy at this point, or whether it is best to wait until he is more committed in the future.  At this point he does not have any appointments and none will be scheduled unless he calls in.  Recommendations:  Return to therapy if he schedules another appointment and talk about whether this is the right treatment for him right now  Plan: Return again as needed, desired  Diagnosis:  Encounter Diagnoses  Name Primary?   Alcohol use disorder, severe, dependence Yes   Episode of recurrent major depressive disorder, unspecified depression episode severity       Collaboration of Care:  None needed at this time - has not followed through with recommendations or appointments  Patient/Guardian was advised Release of Information must be obtained prior to any record release in order to collaborate their care with an outside provider. Patient/Guardian was advised if they have not already done so to contact the registration department to sign all necessary forms in order for [place] to release information regarding their care.   Consent: Patient/Guardian gives verbal consent for treatment and assignment of benefits for services provided during this visit. Patient/Guardian expressed understanding and agreed to proceed.    Lynnell Chad, LCSW 03/05/2023

## 2023-04-07 ENCOUNTER — Other Ambulatory Visit (HOSPITAL_COMMUNITY)
Admission: EM | Admit: 2023-04-07 | Discharge: 2023-04-09 | Disposition: A | Discharge status: Home / Self Care | Payer: 59 | Attending: Psychiatry | Admitting: Psychiatry

## 2023-04-07 DIAGNOSIS — F411 Generalized anxiety disorder: Secondary | ICD-10-CM | POA: Insufficient documentation

## 2023-04-07 DIAGNOSIS — F4310 Post-traumatic stress disorder, unspecified: Secondary | ICD-10-CM | POA: Diagnosis not present

## 2023-04-07 DIAGNOSIS — Z79899 Other long term (current) drug therapy: Secondary | ICD-10-CM | POA: Insufficient documentation

## 2023-04-07 DIAGNOSIS — F329 Major depressive disorder, single episode, unspecified: Secondary | ICD-10-CM | POA: Insufficient documentation

## 2023-04-07 DIAGNOSIS — F1090 Alcohol use, unspecified, uncomplicated: Secondary | ICD-10-CM | POA: Diagnosis not present

## 2023-04-07 LAB — COMPREHENSIVE METABOLIC PANEL
ALT: 99 U/L — ABNORMAL HIGH (ref 0–44)
AST: 68 U/L — ABNORMAL HIGH (ref 15–41)
Albumin: 3.8 g/dL (ref 3.5–5.0)
Alkaline Phosphatase: 102 U/L (ref 38–126)
Anion gap: 12 (ref 5–15)
BUN: 5 mg/dL — ABNORMAL LOW (ref 6–20)
CO2: 29 mmol/L (ref 22–32)
Calcium: 9.5 mg/dL (ref 8.9–10.3)
Chloride: 96 mmol/L — ABNORMAL LOW (ref 98–111)
Creatinine, Ser: 1.19 mg/dL (ref 0.61–1.24)
GFR, Estimated: >60 mL/min (ref >60)
Glucose, Bld: 104 mg/dL — ABNORMAL HIGH (ref 70–99)
Potassium: 4.5 mmol/L (ref 3.5–5.1)
Sodium: 137 mmol/L (ref 135–145)
Total Bilirubin: 0.3 mg/dL (ref 0.3–1.2)
Total Protein: 7 g/dL (ref 6.5–8.1)

## 2023-04-07 LAB — POCT URINE DRUG SCREEN - MANUAL ENTRY (I-SCREEN)
POC Amphetamine UR: NOT DETECTED
POC Buprenorphine (BUP): NOT DETECTED
POC Cocaine UR: NOT DETECTED
POC Marijuana UR: POSITIVE — AB
POC Methadone UR: NOT DETECTED
POC Methamphetamine UR: NOT DETECTED
POC Morphine: NOT DETECTED
POC Oxazepam (BZO): NOT DETECTED
POC Oxycodone UR: NOT DETECTED
POC Secobarbital (BAR): NOT DETECTED

## 2023-04-07 LAB — CBC WITH DIFFERENTIAL/PLATELET
Abs Immature Granulocytes: 0.05 10*3/uL (ref 0.00–0.07)
Basophils Absolute: 0.1 10*3/uL (ref 0.0–0.1)
Basophils Relative: 1 %
Eosinophils Absolute: 0.1 10*3/uL (ref 0.0–0.5)
Eosinophils Relative: 1 %
HCT: 47.2 % (ref 39.0–52.0)
Hemoglobin: 15.5 g/dL (ref 13.0–17.0)
Immature Granulocytes: 1 %
Lymphocytes Relative: 20 %
Lymphs Abs: 1.8 10*3/uL (ref 0.7–4.0)
MCH: 28.9 pg (ref 26.0–34.0)
MCHC: 32.8 g/dL (ref 30.0–36.0)
MCV: 87.9 fL (ref 80.0–100.0)
Monocytes Absolute: 1 10*3/uL (ref 0.1–1.0)
Monocytes Relative: 12 %
Neutro Abs: 6 10*3/uL (ref 1.7–7.7)
Neutrophils Relative %: 65 %
Platelets: 245 10*3/uL (ref 150–400)
RBC: 5.37 MIL/uL (ref 4.22–5.81)
RDW: 13.8 % (ref 11.5–15.5)
WBC: 9 10*3/uL (ref 4.0–10.5)
nRBC: 0 % (ref 0.0–0.2)

## 2023-04-07 LAB — ETHANOL: Alcohol, Ethyl (B): 188 mg/dL — ABNORMAL HIGH (ref <10)

## 2023-04-07 LAB — LDL CHOLESTEROL, DIRECT: Direct LDL: 125 mg/dL — ABNORMAL HIGH (ref 0–99)

## 2023-04-07 LAB — LIPID PANEL
Cholesterol: 234 mg/dL — ABNORMAL HIGH (ref 0–200)
HDL: 44 mg/dL (ref >40)
LDL Cholesterol: UNDETERMINED mg/dL (ref 0–99)
Total CHOL/HDL Ratio: 5.3 RATIO
Triglycerides: 547 mg/dL — ABNORMAL HIGH (ref <150)
VLDL: UNDETERMINED mg/dL (ref 0–40)

## 2023-04-07 MED ORDER — LORAZEPAM 1 MG PO TABS: 1.0000 mg | ORAL_TABLET | Freq: Four times a day (QID) | ORAL | Status: DC | PRN

## 2023-04-07 MED ORDER — NICOTINE 14 MG/24HR TD PT24
14.0000 mg | MEDICATED_PATCH | Freq: Once | TRANSDERMAL | Status: AC
  Administered 2023-04-07: 14 mg via TRANSDERMAL
  Filled 2023-04-07: qty 1

## 2023-04-07 MED ORDER — LORAZEPAM 1 MG PO TABS: 1.0000 mg | ORAL_TABLET | Freq: Two times a day (BID) | ORAL | Status: DC

## 2023-04-07 MED ORDER — LORAZEPAM 1 MG PO TABS
1.0000 mg | ORAL_TABLET | Freq: Four times a day (QID) | ORAL | Status: AC
  Administered 2023-04-07 – 2023-04-08 (×6): 1 mg via ORAL
  Filled 2023-04-07 (×6): qty 1

## 2023-04-07 MED ORDER — MAGNESIUM HYDROXIDE 400 MG/5ML PO SUSP: 30.0000 mL | Freq: Every day | ORAL | Status: DC | PRN

## 2023-04-07 MED ORDER — ADULT MULTIVITAMIN W/MINERALS CH
1.0000 | ORAL_TABLET | Freq: Every day | ORAL | Status: DC
  Administered 2023-04-07 – 2023-04-09 (×3): 1 via ORAL
  Filled 2023-04-07 (×3): qty 1

## 2023-04-07 MED ORDER — HYDROXYZINE HCL 25 MG PO TABS
25.0000 mg | ORAL_TABLET | Freq: Four times a day (QID) | ORAL | Status: DC | PRN
  Administered 2023-04-07 – 2023-04-08 (×3): 25 mg via ORAL
  Filled 2023-04-07 (×3): qty 1

## 2023-04-07 MED ORDER — METOPROLOL SUCCINATE ER 50 MG PO TB24
50.0000 mg | ORAL_TABLET | Freq: Every day | ORAL | Status: DC
  Administered 2023-04-07 – 2023-04-09 (×3): 50 mg via ORAL
  Filled 2023-04-07 (×3): qty 1

## 2023-04-07 MED ORDER — ACETAMINOPHEN 325 MG PO TABS: 650.0000 mg | ORAL_TABLET | Freq: Four times a day (QID) | ORAL | Status: DC | PRN

## 2023-04-07 MED ORDER — LORAZEPAM 1 MG PO TABS: 1.0000 mg | ORAL_TABLET | Freq: Every day | ORAL | Status: DC

## 2023-04-07 MED ORDER — TRAZODONE HCL 50 MG PO TABS
50.0000 mg | ORAL_TABLET | Freq: Every evening | ORAL | Status: DC | PRN
  Administered 2023-04-07 – 2023-04-08 (×2): 50 mg via ORAL
  Filled 2023-04-07 (×2): qty 1

## 2023-04-07 MED ORDER — LORAZEPAM 1 MG PO TABS
1.0000 mg | ORAL_TABLET | Freq: Three times a day (TID) | ORAL | Status: DC
  Administered 2023-04-08: 1 mg via ORAL
  Filled 2023-04-07: qty 1

## 2023-04-07 MED ORDER — THIAMINE HCL 100 MG/ML IJ SOLN
100.0000 mg | Freq: Once | INTRAMUSCULAR | Status: DC
  Filled 2023-04-07: qty 2

## 2023-04-07 MED ORDER — ONDANSETRON 4 MG PO TBDP: 4.0000 mg | ORAL_TABLET | Freq: Four times a day (QID) | ORAL | Status: DC | PRN

## 2023-04-07 MED ORDER — CLONIDINE HCL 0.1 MG PO TABS
0.1000 mg | ORAL_TABLET | Freq: Once | ORAL | Status: AC
  Administered 2023-04-07: 0.1 mg via ORAL
  Filled 2023-04-07: qty 1

## 2023-04-07 MED ORDER — THIAMINE MONONITRATE 100 MG PO TABS
100.0000 mg | ORAL_TABLET | Freq: Every day | ORAL | Status: DC
  Administered 2023-04-08 – 2023-04-09 (×2): 100 mg via ORAL
  Filled 2023-04-07 (×2): qty 1

## 2023-04-07 MED ORDER — LOPERAMIDE HCL 2 MG PO CAPS: 2.0000 mg | ORAL_CAPSULE | ORAL | Status: DC | PRN

## 2023-04-07 MED ORDER — ALUM & MAG HYDROXIDE-SIMETH 200-200-20 MG/5ML PO SUSP: 30.0000 mL | ORAL | Status: DC | PRN

## 2023-04-07 NOTE — ED Notes (Signed)
Patient was awake for breakfast and medication.  Appears dysphoric with sad affect.  Denies avh shi or plan.  Will monitor.
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
## 2023-04-07 NOTE — ED Notes (Signed)
Snacks given 

## 2023-04-07 NOTE — ED Notes (Signed)
Patient was provided with dinner 
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
## 2023-04-07 NOTE — ED Notes (Signed)
Pt admitted to [facility] endorsing ETOH abuse and seeking treatment. Patient was cooperative during the admission assessment. Skin assessment complete. Patient oriented to unit and unit rules. Meal and drinks offered to patient.  Patient verbalized agreement to treatment plans. Patient verbally contracts for safety while hospitalized. Will monitor for safety.

## 2023-04-07 NOTE — ED Notes (Signed)
Patient is calm and quiet on unit.  NO issues or complaints.  No withdrawal symptoms notes.  Pleasant on approach.  Will monitor and provide support as needed.

## 2023-04-07 NOTE — ED Notes (Signed)
Patient was provided with breakfast 

## 2023-04-07 NOTE — ED Notes (Signed)
Patient was provided with lunch 

## 2023-04-07 NOTE — ED Notes (Signed)
Pt observed/assessed in room sleeping. RR even and unlabored, appearing in no noted distress. Environmental check complete, will continue to monitor for safety 

## 2023-04-07 NOTE — Progress Notes (Signed)
   04/07/23 0328  [facility] Triage Screening (Walk-ins at [facility] only)  How Did You Hear About [place]? Self  What Is the Reason for Your Visit/Call Today? Pt presents to [facility] voluntary, unaccompanied requesting substance abuse treatment/detox. Pt reports drinking aba fifth of liquor a day for the last 5 months. Pt also reports trying to scale back to just having 10 beers a day, but was unsucessful as he was unable to function. Pt is experiencing cold sweats, feeling feverish, no appetite. Pt also reports taking 2 shots of liquor prior to arrival to [facility]. Pt denies SI, HI, AVH.  How Long Has This Been Causing You Problems? > than 6 months  Have You Recently Had Any Thoughts About Hurting Yourself? No  Are You Planning to Commit Suicide/Harm Yourself At This time? No  Have you Recently Had Thoughts About Hurting Someone Brett Munoz? No  Are You Planning To Harm Someone At This Time? No  Are you currently experiencing any auditory, visual or other hallucinations? No  Have You Used Any Alcohol or Drugs in the Past 24 Hours? Yes  How long ago did you use Drugs or Alcohol? about 30 mins ago  What Did You Use and How Much? 2 shots of liquor  Do you have any current medical co-morbidities that require immediate attention? No  Clinician description of patient physical appearance/behavior: Pt is calm, cooperative and oriented  What Do You Feel Would Help You the Most Today? Alcohol or Drug Use Treatment  If access to [facility] was not available, would you have sought care in the Emergency Department? No  Determination of Need Urgent (48 hours)  Options For Referral Other: Comment;Outpatient Therapy;Facility-Based Crisis;Chemical Dependency Intensive Outpatient Therapy (CDIOP)

## 2023-04-07 NOTE — ED Notes (Signed)
Pt isolating in room. A/O, denies SI/HI/AVH. Voices c/o mild withdrawal symptoms with noted improvement today. He is pleasant, engaged, sad. No noted distress.Will continue to monitor for safety

## 2023-04-07 NOTE — Group Note (Signed)
Group Topic: Positive Affirmations  Group Date: 04/07/2023 Start Time: 1210 End Time: 1240 Facilitators: Maeola Sarah  Department: [facility]  Number of Participants: 8  Group Focus: affirmation Treatment Modality:  Psychoeducation Interventions utilized were patient education Purpose: regain self-worth and reinforce self-care  Name: Brett Munoz Dearinger Date of Birth: 10-24-1991  MR: [id number]    Level of Participation: active Quality of Participation: attentive and cooperative Interactions with others: gave feedback Mood/Affect: appropriate Triggers (if applicable): N/A Cognition: coherent/clear Progress: Gaining insight Response: Patient stated that his positive affirmation will be "The scariest part is almost over". 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

## 2023-04-07 NOTE — Group Note (Signed)
Group Topic: Recovery Basics  Group Date: 04/07/2023 Start Time: 1600 End Time: 1630 Facilitators: Jenean Lindau, RN  Department: [facility]  Number of Participants: 7  Group Focus: chemical dependency education and chemical dependency issues Treatment Modality:  Psychoeducation Interventions utilized were patient education and reality testing Purpose: enhance coping skills, explore maladaptive thinking, express feelings, express irrational fears, improve communication skills, increase insight, regain self-worth, relapse prevention strategies, and trigger / craving management  Name: Brett Munoz Date of Birth: 12-Jun-1991  MR: [id number]    Level of Participation: moderate Quality of Participation: cooperative Interactions with others: gave feedback Mood/Affect: appropriate Triggers (if applicable):   Cognition: goal directed Progress: Moderate Response:   Plan: follow-up needed  Patients Problems:  Patient Active Problem List   Diagnosis Date Noted   Alcohol use disorder 04/07/2023   Neuropathy 08/29/2022   ADHD 08/29/2022   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

## 2023-04-07 NOTE — Group Note (Signed)
Group Topic: Relapse and Recovery  Group Date: 04/07/2023 Start Time: 1957 End Time: 2100 Facilitators: Levander Campion  Department: [facility]  Number of Participants: 7  Group Focus: problem solving and substance abuse education Treatment Modality:  Behavior Modification Therapy and Solution-Focused Therapy Interventions utilized were patient education, story telling, and support Purpose: regain self-worth and relapse prevention strategies  Name: Brett Munoz Date of Birth: 03-18-1991  MR: [id number]    Level of Participation: moderate Quality of Participation: attentive and cooperative Interactions with others: gave feedback Mood/Affect: appropriate Triggers (if applicable): n/a Cognition: concrete Progress: Moderate Response: n/a Plan: follow-up needed  Patients Problems:  Patient Active Problem List   Diagnosis Date Noted   Alcohol use disorder 04/07/2023   Neuropathy 08/29/2022   ADHD 08/29/2022   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

## 2023-04-08 ENCOUNTER — Encounter (HOSPITAL_COMMUNITY): Payer: Self-pay | Admitting: Nurse Practitioner

## 2023-04-08 DIAGNOSIS — F1090 Alcohol use, unspecified, uncomplicated: Secondary | ICD-10-CM | POA: Diagnosis not present
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
## 2023-04-08 NOTE — Group Note (Signed)
Group Topic: Positive Affirmations  Group Date: 04/08/2023 Start Time: 1030 End Time: 1200 Facilitators: Vonzell Schlatter B  Department: [facility]  Number of Participants: 8  Group Focus: daily focus Treatment Modality:  Psychoeducation Interventions utilized were patient education Purpose: regain self-worth  Name: Brett Munoz Date of Birth: Sep 06, 1991  MR: [id number]    Level of Participation: minimal Quality of Participation: attentive and cooperative Interactions with others: gave feedback Mood/Affect: positive Triggers (if applicable): na Cognition: coherent/clear Progress: Moderate Response: na Plan: follow-up needed  Patients Problems:  Patient Active Problem List   Diagnosis Date Noted   Alcohol use disorder 04/07/2023   Neuropathy 08/29/2022   ADHD 08/29/2022   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

## 2023-04-08 NOTE — Group Note (Signed)
Group Topic: Wellness  Group Date: 04/08/2023 Start Time: 0900 End Time: 0930 Facilitators: Prentice Docker, RN  Department: [facility]  Number of Participants: 8  Group Focus: nursing group Treatment Modality:  Psychoeducation Interventions utilized were patient education Purpose: medication education  Name: Brett Munoz Date of Birth: 08-Mar-1991  MR: [id number]    Level of Participation: active Quality of Participation: cooperative Interactions with others: gave feedback Mood/Affect: positive Triggers (if applicable): none Cognition: insightful Progress: Gaining insight Response: information well received Plan: patient will be encouraged to continue medication compliance Patients Problems:  Patient Active Problem List   Diagnosis Date Noted   Alcohol use disorder 04/07/2023   Neuropathy 08/29/2022   ADHD 08/29/2022   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

## 2023-04-08 NOTE — ED Notes (Signed)
Patient is calm and cooperative, he states that he is feeling better since coming here. He hopes to get into a program to help him recover from his addiction and also help him to deal with his anxiety and depression. Patient is very pleasant and polite and communicates easily. Will continue to monitor for safety.

## 2023-04-08 NOTE — ED Notes (Signed)
Pt sleeping in no acute distress. RR even and unlabored. Environment secured. Will continue to monitor for safety. 

## 2023-04-08 NOTE — ED Notes (Signed)
Pt observed/assessed in room sleeping. RR even and unlabored, appearing in no noted distress. Environmental check complete, will continue to monitor for safety 

## 2023-04-08 NOTE — ED Notes (Signed)
Patient A&Ox4. Denies intent to harm self/others when asked. Denies A/VH. Patient denies any physical complaints when asked. No acute distress noted. Pt states, "I finally got my first good nights' sleep last night. I feel pretty good". Support and encouragement provided. Routine safety checks conducted according to facility protocol. Encouraged patient to notify staff if thoughts of harm toward self or others arise. Patient verbalize understanding and agreement. Will continue to monitor for safety.

## 2023-04-09 DIAGNOSIS — F1090 Alcohol use, unspecified, uncomplicated: Secondary | ICD-10-CM | POA: Diagnosis not present

## 2023-04-09 MED ORDER — CLONIDINE HCL 0.1 MG PO TABS: 0.1000 mg | ORAL_TABLET | Freq: Every day | ORAL | 0 refills | Status: DC

## 2023-04-09 MED ORDER — CLONIDINE HCL 0.1 MG PO TABS
0.1000 mg | ORAL_TABLET | Freq: Every day | ORAL | Status: DC
  Administered 2023-04-09: 0.1 mg via ORAL
  Filled 2023-04-09: qty 1

## 2023-04-09 MED ORDER — LORAZEPAM 1 MG PO TABS
1.0000 mg | ORAL_TABLET | Freq: Two times a day (BID) | ORAL | Status: DC
  Administered 2023-04-09: 1 mg via ORAL
  Filled 2023-04-09: qty 1

## 2023-04-09 MED ORDER — TRAZODONE HCL 50 MG PO TABS: 50.0000 mg | ORAL_TABLET | Freq: Every evening | ORAL | 0 refills | Status: DC | PRN

## 2023-04-09 MED ORDER — ONDANSETRON 4 MG PO TBDP: 4.0000 mg | ORAL_TABLET | Freq: Four times a day (QID) | ORAL | 0 refills | Status: DC | PRN

## 2023-04-09 NOTE — Group Note (Signed)
Group Topic: Wellness  Group Date: 04/09/2023 Start Time: 1000 End Time: 1100 Facilitators: Vonzell Schlatter B  Department: [facility]  Number of Participants: 8  Group Focus: substance abuse education AA Group Treatment Modality:  Psychoeducation Interventions utilized were problem solving Purpose: increase insight  Name: Brett Munoz Date of Birth: 01/23/1991  MR: [id number]    Level of Participation: minimal Quality of Participation: attentive and cooperative Interactions with others: gave feedback Mood/Affect: positive Triggers (if applicable): na Cognition: coherent/clear Progress: Moderate Response: na Plan: follow-up needed  Patients Problems:  Patient Active Problem List   Diagnosis Date Noted   Alcohol use disorder 04/07/2023   Neuropathy 08/29/2022   ADHD 08/29/2022   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

## 2023-04-09 NOTE — ED Notes (Signed)
Patient is awake at this time ans thinking of leaving AMA. "Do you think now is a good time to leave AMA or should I wait until the morning?". Patient talked about wanting to get into [facility], patient told that leaving AMA may jeopardize his acceptance into the [facility] program. Patient talked about needing to get a new AA sponsor as he does not feel his current sponsor is a good fit. Patient is drinking juice in the dayroom Will continue to monitor for safety.

## 2023-04-09 NOTE — ED Provider Notes (Signed)
FBC/OBS ASAP Discharge Summary  Patient requested to leave AMA on 04/09/23. Does not meet inpatient psychiatry criteria nor IVC criteria  Date and Time: 04/09/2023 9:53 AM  Name: Brett Munoz  MRN:  [id number]   Discharge Diagnoses:  Final diagnoses:  Alcohol use disorder    Subjective: Patient requesting to leave AMA. Understands risk of severe alcohol withdrawal and plans to have a friend check in on him daily. Denies SI/HI/AVH.   Stay Summary:  Brett Munoz is a 32 yr old male who presented on 5/25 to [facility] requesting EtOH Detox, he was admitted to [facility] on 5/25.  PPHx is significant for GAD, MDD, PTSD and EtOH Abuse.   Patient was started on Ativan taper and CIWA while admitted.  He felt he was experiencing minimal alcohol withdrawal while on the taper.  However, patient decided to leave AGAINST MEDICAL ADVICE on 04/09/2023 due to feeling anxious and confined at the facility base crisis.  He was discharged with clonidine today with blood pressure and Zofran for nausea/vomiting.  He was told to go to the ED should he experience significant further with alcohol withdrawal symptoms.  Patient plans to follow-up with [facility] or AA for  outpatient care regarding his alcohol use disorder.   Total Time spent with patient: 45 minutes  Past Psychiatric History: GAD, MDD, PTSD and EtOH Abuse. Past Medical History: HTN Family History: None Reported Family Psychiatric  History: father-gad, mdd, aunts, uncle gad, mdd bipolar disorder  Social History: Single and lives alone.N/A, patient does not currently use tobacco products  Current Medications:  Current Facility-Administered Medications  Medication Dose Route Frequency Provider Last Rate Last Admin   acetaminophen (TYLENOL) tablet 650 mg  650 mg Oral Q6H PRN Bobbitt, Shalon E, NP       alum & mag hydroxide-simeth (MAALOX/MYLANTA) 200-200-20 MG/5ML suspension 30 mL  30 mL Oral Q4H PRN Bobbitt, Shalon E, NP       cloNIDine (CATAPRES) tablet 0.1  mg  0.1 mg Oral Daily Park Pope, MD       hydrOXYzine (ATARAX) tablet 25 mg  25 mg Oral Q6H PRN Bobbitt, Shalon E, NP   25 mg at 04/08/23 2109   loperamide (IMODIUM) capsule 2-4 mg  2-4 mg Oral PRN Bobbitt, Shalon E, NP       LORazepam (ATIVAN) tablet 1 mg  1 mg Oral Q6H PRN Bobbitt, Shalon E, NP       LORazepam (ATIVAN) tablet 1 mg  1 mg Oral BID Park Pope, MD   1 mg at 04/09/23 4098   magnesium hydroxide (MILK OF MAGNESIA) suspension 30 mL  30 mL Oral Daily PRN Bobbitt, Shalon E, NP       metoprolol succinate (TOPROL-XL) 24 hr tablet 50 mg  50 mg Oral Daily Bobbitt, Shalon E, NP   50 mg at 04/09/23 0630   multivitamin with minerals tablet 1 tablet  1 tablet Oral Daily Bobbitt, Shalon E, NP   1 tablet at 04/09/23 0911   ondansetron (ZOFRAN-ODT) disintegrating tablet 4 mg  4 mg Oral Q6H PRN Bobbitt, Shalon E, NP       thiamine (VITAMIN B1) injection 100 mg  100 mg Intramuscular Once Bobbitt, Shalon E, NP       thiamine (VITAMIN B1) tablet 100 mg  100 mg Oral Daily Bobbitt, Shalon E, NP   100 mg at 04/09/23 0911   traZODone (DESYREL) tablet 50 mg  50 mg Oral QHS PRN Bobbitt, Shalon E, NP   50 mg  at 04/08/23 2108   Current Outpatient Medications  Medication Sig Dispense Refill   diphenhydrAMINE (BENADRYL) 25 mg capsule Take 25-50 mg by mouth at bedtime as needed for sleep.     metoprolol succinate (TOPROL-XL) 50 MG 24 hr tablet Take 1 tablet (50 mg total) by mouth daily. 90 tablet 1   naproxen sodium (ALEVE) 220 MG tablet Take 440 mg by mouth 2 (two) times daily as needed (tendonitis pain).     cloNIDine (CATAPRES) 0.1 MG tablet Take 1 tablet (0.1 mg total) by mouth daily. 30 tablet 0   ondansetron (ZOFRAN-ODT) 4 MG disintegrating tablet Take 1 tablet (4 mg total) by mouth every 6 (six) hours as needed for nausea or vomiting. 20 tablet 0   traZODone (DESYREL) 50 MG tablet Take 1 tablet (50 mg total) by mouth at bedtime as needed for sleep. 30 tablet 0    PTA Medications:  Facility Ordered  Medications  Medication   acetaminophen (TYLENOL) tablet 650 mg   alum & mag hydroxide-simeth (MAALOX/MYLANTA) 200-200-20 MG/5ML suspension 30 mL   magnesium hydroxide (MILK OF MAGNESIA) suspension 30 mL   traZODone (DESYREL) tablet 50 mg   thiamine (VITAMIN B1) injection 100 mg   thiamine (VITAMIN B1) tablet 100 mg   multivitamin with minerals tablet 1 tablet   LORazepam (ATIVAN) tablet 1 mg   hydrOXYzine (ATARAX) tablet 25 mg   loperamide (IMODIUM) capsule 2-4 mg   ondansetron (ZOFRAN-ODT) disintegrating tablet 4 mg   [COMPLETED] LORazepam (ATIVAN) tablet 1 mg   [COMPLETED] cloNIDine (CATAPRES) tablet 0.1 mg   metoprolol succinate (TOPROL-XL) 24 hr tablet 50 mg   [COMPLETED] nicotine (NICODERM CQ - dosed in mg/24 hours) patch 14 mg   LORazepam (ATIVAN) tablet 1 mg   cloNIDine (CATAPRES) tablet 0.1 mg   PTA Medications  Medication Sig   metoprolol succinate (TOPROL-XL) 50 MG 24 hr tablet Take 1 tablet (50 mg total) by mouth daily.   ondansetron (ZOFRAN-ODT) 4 MG disintegrating tablet Take 1 tablet (4 mg total) by mouth every 6 (six) hours as needed for nausea or vomiting.   traZODone (DESYREL) 50 MG tablet Take 1 tablet (50 mg total) by mouth at bedtime as needed for sleep.   cloNIDine (CATAPRES) 0.1 MG tablet Take 1 tablet (0.1 mg total) by mouth daily.       04/07/2023    8:26 AM 11/27/2022    5:31 PM 11/02/2022    1:34 PM  Depression screen PHQ 2/9  Decreased Interest 1 2 1   Down, Depressed, Hopeless 0 2 2  PHQ - 2 Score 1 4 3   Altered sleeping 3 3 0  Tired, decreased energy 2 3 1   Change in appetite 2 0 0  Feeling bad or failure about yourself  1 2 1   Trouble concentrating 2 0 0  Moving slowly or fidgety/restless 3 0 0  Suicidal thoughts 0 0 0  PHQ-9 Score 14 12 5   Difficult doing work/chores Very difficult Somewhat difficult Not difficult at all    [facility] ED from 04/07/2023 in [facility] from 12/11/2022 in [facility]patient Behavioral Health at [facility] from 11/27/2022 in [facility] Outpatient Behavioral Health at [facility] RISK CATEGORY No Risk No Risk No Risk       Musculoskeletal  Strength & Muscle Tone: within normal limits Gait & Station: normal Patient leans: N/A  Psychiatric Specialty Exam  Presentation  General Appearance:  Appropriate for Environment; Casual  Eye Contact: Good  Speech: Clear and Coherent; Normal Rate  Speech Volume: Normal  Handedness: Right   Mood and Affect  Mood: Dysphoric  Affect: Congruent   Thought Process  Thought Processes: Coherent; Goal Directed  Descriptions of Associations:Intact  Orientation:Full (Time, Place and Person)  Thought Content:WDL; Logical  Diagnosis of Schizophrenia or Schizoaffective disorder in past: No    Hallucinations:Hallucinations: None  Ideas of Reference:None  Suicidal Thoughts:Suicidal Thoughts: No  Homicidal Thoughts:Homicidal Thoughts: No   Sensorium  Memory: Immediate Good; Recent Good  Judgment: Good  Insight: Good   Executive Functions  Concentration: Good  Attention Span: Good  Recall: Good  Fund of Knowledge: Good  Language: Good   Psychomotor Activity  Psychomotor Activity: Psychomotor Activity: Normal   Assets  Assets: Communication Skills; Desire for Improvement; Physical Health; Resilience   Sleep  Sleep: Sleep: Good   No data recorded  Physical Exam  Physical Exam ROS Blood pressure (!) 145/100, pulse (!) 114, temperature 98.5 F (36.9 C), temperature source Tympanic, resp. rate 18, SpO2 100 %. There is no height or weight on file to calculate BMI.  Demographic Factors:  Male, Caucasian, and Living alone  Loss Factors: NA  Historical Factors: NA  Risk Reduction Factors:   Employed, Positive therapeutic relationship, and Positive coping skills or problem solving skills  Continued Clinical Symptoms:   Alcohol/Substance Abuse/Dependencies More than one psychiatric diagnosis Previous Psychiatric Diagnoses and Treatments  Cognitive Features That Contribute To Risk:  None    Suicide Risk:  Mild:   There are no identifiable plans, no associated intent, mild dysphoria and related symptoms, good self-control (both objective and subjective assessment), few other risk factors, and identifiable protective factors, including available and accessible social support.  Plan Of Care/Follow-up recommendations:   Follow-up recommendations:   Activity:  as tolerated Diet:  heart healthy   Comments:  Prescriptions were given at discharge.  Patient is agreeable with the discharge plan.  Patient was given an opportunity to ask questions.  Patient appears to feel comfortable with discharge and denies any current suicidal or homicidal thoughts.    Patient is instructed prior to discharge to: Take all medications as prescribed by mental healthcare provider. Report any adverse effects and or reactions from the medicines to outpatient provider promptly. In the event of worsening symptoms, patient is instructed to call the crisis hotline, 911 and or go to the nearest ED for appropriate evaluation and treatment of symptoms. Patient is to follow-up with primary care provider for other medical issues, concerns and or health care needs.   Park Pope, MD 04/09/2023, 9:53 AM

## 2023-04-09 NOTE — ED Notes (Signed)
Rn notified provider of p[patients blood pressure. Patient denies any s/s of hypertension.

## 2023-04-09 NOTE — ED Notes (Signed)
Patient refused to do suicide safety.

## 2023-04-09 NOTE — Group Note (Signed)
Group Topic: Change and Accountability  Group Date: 04/08/2023 Start Time: 0730 End Time: 0830 Facilitators: Rae Lips B  Department: [facility]  Number of Participants: 4  Group Focus: acceptance Treatment Modality:  Exposure Therapy Interventions utilized were leisure development Purpose: increase insight  Name: Brett Munoz Date of Birth: [birth year]  MR: [id number]    Level of Participation: active Quality of Participation: attentive Interactions with others: gave feedback Mood/Affect: appropriate Triggers (if applicable): NA Cognition: coherent/clear Progress: Gaining insight Response: NA Plan: patient will be encouraged to keep going to groups  Patients Problems:  Patient Active Problem List   Diagnosis Date Noted   Alcohol use disorder 04/07/2023   Neuropathy 08/29/2022   ADHD 08/29/2022   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

## 2023-04-09 NOTE — ED Notes (Signed)
Patient  resting in no acute stress. RR even and unlabored .Environment secured .Will continue to monitor for safely. 

## 2023-04-09 NOTE — Discharge Instructions (Signed)
[facility] [address], [place], [address] [phone] phone  New Patient Assessment/Therapy Walk-Ins:  Monday and Wednesday: 8 am until slots are full. Every 1st and 2nd Fridays of the month: 1 pm - 5 pm.  NO ASSESSMENT/THERAPY WALK-INS ON TUESDAYS OR THURSDAYS  New Patient Assessment/Medication Management Walk-Ins:  Monday - Friday:  8 am - 11 am.  For all walk-ins, we ask that you arrive by 7:30 am because patients will be seen in the order of arrival.  Availability is limited; therefore, you may not be seen on the same day that you walk-in.  Our goal is to serve and meet the needs of our community to the best of our ability.  SUBSTANCE USE TREATMENT   [facility] [address], [place], [address] [phone] phone NOTE: [facility] is no longer offering IOP services.  Serves those who are low-income or have no insurance.  [facility] [address] [place], [place], [address] [phone] phone [phone] fax NOTE: Does have Substance Abuse-Intensive Outpatient Program [facility]) as well as transitional housing if eligible.  [facility] [address] [place], [place], [address] [phone] phone [phone] fax  [facility] [phone] [place], [place], [address] [phone] phone [phone] fax  HALFWAY HOUSES:  [facility] [phone]  [employer].oxfordvacancies.com  12 STEP PROGRAMS:  Alcoholics Anonymous of [place] [employer]  Narcotics Anonymous of [place] [employer]  Al-Anon of [place], [place] www.[place]alanon.org/find-meetings.html  Nar-Anon https://nar-anon.org/find-a-meetin  List of Residential placements:   [facility] in [place]: [phone]  [facility]: [phone]  [place], [place] [phone]: Male and male  facility; 30-day program: (uninsured and Medicaid such as Laurena Bering, [place], [place], partners)  [facility]: [phone]; men and women's facility; 28 days; Can have Medicaid tailored plan Tour manager or Partners)  [facility]: [phone] Karoline Caldwell or Larita Fife; 28 day program; must be fully detox; tailored Medicaid or no insurance  [address] in [place], [place]; [phone]; 28 day all males program; no insurance accepted  [facility] in [place]: Gabriel Rung [phone] (no insurance or Medicaid only); 90 days; outpatient services but provide housing in apartments downtown [place]  [facility] Admission: [phone]: Patient must complete phone screening for placement: [place], [place]; 6 month program; uninsured, Medicaid, and [employer].   [facility]: no insurance required; [phone]  [facility]: [phone]; Intake: Molly Maduro; Must fill out application online; Alecia Lemming Delay [phone] [facility] in [place], [place]: [phone]; Admissions Coordinators Mr. Maurine Minister or Barron Alvine; 90 day program.  [facility]: [place], [place] [phone]; Co-Ed 9 month to a year program; Online application; Men entry fee is $500 (6-69months);  [employer]: [address] [place], [place] [address]; no fee or insurance required; minimum of 2 years; Highly structured; work based; Intake Coordinator is Thayer Ohm [phone]  [facility] in [place], [place]: [phone]; Fax number is [phone]; website: www.[facility].org; Requires 3-6 page autobiography; 2 year program (18 months and then 17month transitional housing); Admission fee is $300; no insurance needed; work Automotive engineer in [place], [place]: [employer] Desk Staff: Danise Edge [phone]: They have a Men's Regenerations Program 6-69months. Free program; There is an initial $300 fee however, they are willing to work with patients regarding that.  Application is online.  [facility]: Admissions [phone] Doran Heater ext [phone]; Any 7-90 day program is out of pocket; 12 month program is free  of charge; there is a $275 entry fee; Patient is responsible for own transportation

## 2023-04-09 NOTE — ED Notes (Signed)
Patient mentioned that he has an "essential tremor"  not related to alcohol withdrawal.

## 2023-04-09 NOTE — ED Notes (Signed)
Provider states not to give the metoprolol.

## 2023-04-09 NOTE — ED Notes (Signed)
Patient alert and oriented x 3. Denies SI/HI/AVH. Denies intent or plan to harm self or others. Routine conducted according to faculty protocol. Encourage patient to notify staff with any needs or concerns. Patient verbalized agreement and understanding. Will continue to monitor for safety. 

## 2023-04-09 NOTE — ED Notes (Signed)
Patient awake and going to the dayroom unable to sleep.

## 2023-04-09 NOTE — ED Notes (Signed)
Patient A&O x 4, ambulatory. Patient discharged in no acute distress. Patient denied SI/HI, A/VH upon discharge. Patient verbalized understanding of all discharge instructions explained by staff, to include follow up appointments, RX's and safety plan.  Pt belongings returned to patient from locker #  28 intact. Patient escorted to lobby via staff for transport to destination. Safety maintained.  Patient left AMA . Ama form was signed and went over with patient.

## 2023-04-09 NOTE — ED Notes (Signed)
Rn notified provider of patients blood pressure 146/96 pulse 97 . Patient denies any s/s of hypertension.Will continue to monitor for safety.

## 2023-04-10 ENCOUNTER — Encounter: Payer: Self-pay | Admitting: Family Medicine

## 2023-04-11 NOTE — Telephone Encounter (Signed)
Pt is [facility] employee and is needing STI panel done (no symptoms) but can get testing free at their service centers. He is wondering if you are ok with ordering and if so he will give details on what he needs. (If you are ok w this I will send hardcopy orders to pt to get this done)

## 2023-04-13 ENCOUNTER — Telehealth (INDEPENDENT_AMBULATORY_CARE_PROVIDER_SITE_OTHER): Payer: Managed Care, Other (non HMO) | Admitting: Family Medicine

## 2023-04-13 ENCOUNTER — Encounter: Payer: Self-pay | Admitting: Family Medicine

## 2023-04-13 ENCOUNTER — Other Ambulatory Visit: Payer: Self-pay | Admitting: Family Medicine

## 2023-04-13 DIAGNOSIS — Z209 Contact with and (suspected) exposure to unspecified communicable disease: Secondary | ICD-10-CM | POA: Diagnosis not present

## 2023-04-13 DIAGNOSIS — Z7251 High risk heterosexual behavior: Secondary | ICD-10-CM | POA: Insufficient documentation

## 2023-04-13 NOTE — Progress Notes (Signed)
MyChart Video Visit    Virtual Visit via Video Note    Patient location: Home. Patient and provider in visit Provider location: Office  I discussed the limitations of evaluation and management by telemedicine and the availability of in person appointments. The patient expressed understanding and agreed to proceed.  Visit Date: 04/13/2023  Today's healthcare provider: Hetty Blend, NP-C     Subjective:    Patient ID: Brett Munoz, male    DOB: [birth year], 32 y.o.   MRN: [id number]  Chief Complaint  Patient presents with   std testing    HPI  Requests STD testing. Reports having unprotected sex with multiple male partners since he was last tested.  Asymptomatic.   Denies fever, chills, dizziness, abdominal pain, back pain, N/V/D, urinary symptoms.    Would like labs done at [facility] [employer].      Past Medical History:  Diagnosis Date   Internal hemorrhoids     History reviewed. No pertinent surgical history.  Family History  Problem Relation Age of Onset   Cancer Mother    Cancer Father    Depression Father    High blood pressure Father    Arthritis Maternal Grandfather    Cancer Maternal Grandfather    Alcohol abuse Paternal Grandmother    COPD Paternal Grandmother    Mental illness Paternal Grandmother    Alcohol abuse Paternal Grandfather    Heart attack Paternal Grandfather    Heart disease Paternal Grandfather     Social History   Socioeconomic History   Marital status: Single    Spouse name: Not on file   Number of children: Not on file   Years of education: Not on file   Highest education level: Not on file  Occupational History   Not on file  Tobacco Use   Smoking status: Some Days    Types: Cigarettes   Smokeless tobacco: Never  Substance and Sexual Activity   Alcohol use: Yes    Alcohol/week: 48.0 standard drinks of alcohol    Types: 28 Cans of beer, 20 Shots of liquor per week    Comment: Drinks a combination of beer  and liquor.  States that anything less than 4 drinks a day is "a little" for him.   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 30 tablet 0   diphenhydrAMINE (BENADRYL) 25 mg capsule Take 25-50 mg by mouth at bedtime as needed for sleep.     metoprolol succinate (TOPROL-XL) 50 MG 24 hr tablet Take 1 tablet (50 mg total) by mouth daily. 90 tablet 1   naproxen sodium (ALEVE) 220 MG tablet Take 440 mg by mouth 2 (two) times daily as needed (tendonitis pain).     ondansetron (ZOFRAN-ODT)  4 MG disintegrating tablet Take 1 tablet (4 mg total) by mouth every 6 (six) hours as needed for nausea or vomiting. 20 tablet 0   traZODone (DESYREL) 50 MG tablet Take 1 tablet (50 mg total) by mouth at bedtime as needed for sleep. 30 tablet 0   No facility-administered medications prior to visit.    No Known Allergies  ROS     Objective:    Physical Exam  There were no vitals taken for this visit. Wt Readings from Last 3 Encounters:  11/02/22 266 lb (120.7 kg)  10/09/22 266 lb (120.7 kg)  09/20/22 264 lb (119.7 kg)   Alert and oriented and in no distress. Respirations unlabored. Normal speech and mood.      Assessment & Plan:   Problem List Items Addressed This Visit       Other   Sexual behavior with high risk of exposure to communicable disease - Primary    Relevant Orders   GC/Chlamydia Probe Amp   Hepatitis C antibody   RPR   Hepatitis B surface antigen   HIV Antibody (routine testing w rflx)   STD testing ordered. Reports being asymptomatic.  Discussed safe sex practices.  Offered PrEP and he declines. He is aware of this medication.  Follow up pending results.   I am having Chales Abrahams. Calixte maintain his metoprolol succinate, naproxen sodium, diphenhydrAMINE, ondansetron, traZODone, and cloNIDine.  No orders of the defined types were placed in this encounter.   I discussed the assessment and treatment plan with the patient. The patient was provided an opportunity to ask questions and all were answered. The patient agreed with the plan and demonstrated an understanding of the instructions.   The patient was advised to call back or seek an in-person evaluation if the symptoms worsen or if the condition fails to improve as anticipated.  I provided 15 minutes of face-to-face time during this encounter.   Hetty Blend, NP-C [facility] [place] [phone] (phone) [phone] (fax)  [facility]

## 2023-04-17 LAB — HEPATITIS C ANTIBODY: Hep C Virus Ab: NONREACTIVE

## 2023-04-17 LAB — GC/CHLAMYDIA PROBE AMP
Chlamydia trachomatis, NAA: NEGATIVE
Neisseria Gonorrhoeae by PCR: NEGATIVE

## 2023-04-17 LAB — HEPATITIS B SURFACE ANTIGEN: Hepatitis B Surface Ag: NEGATIVE

## 2023-04-17 LAB — RPR: RPR Ser Ql: NONREACTIVE

## 2023-04-17 LAB — HIV ANTIBODY (ROUTINE TESTING W REFLEX): HIV Screen 4th Generation wRfx: NONREACTIVE

## 2023-05-07 ENCOUNTER — Ambulatory Visit (INDEPENDENT_AMBULATORY_CARE_PROVIDER_SITE_OTHER): Payer: 59 | Admitting: Clinical

## 2023-05-07 ENCOUNTER — Encounter (HOSPITAL_COMMUNITY): Payer: Self-pay | Admitting: Clinical

## 2023-05-07 DIAGNOSIS — F1910 Other psychoactive substance abuse, uncomplicated: Secondary | ICD-10-CM | POA: Diagnosis not present

## 2023-05-07 DIAGNOSIS — F419 Anxiety disorder, unspecified: Secondary | ICD-10-CM | POA: Diagnosis not present

## 2023-05-07 DIAGNOSIS — F1021 Alcohol dependence, in remission: Secondary | ICD-10-CM

## 2023-05-07 DIAGNOSIS — F32A Depression, unspecified: Secondary | ICD-10-CM | POA: Diagnosis not present

## 2023-05-07 NOTE — Progress Notes (Signed)
THERAPIST PROGRESS NOTE  Session Time: 4:06-5:05pm  Session #7  Participation Level: Active  Behavioral Response: Casual and Neat Alert Slightly Depressed   Type of Therapy: Individual Therapy  Treatment Goals addressed:  LTG PHQ score will be reduced from current score of 12 to no more than 7  STG will learn about symptoms of depression and be able to verbalize 5 ways in which it is likely that his depression contributes to his substance abuse problems  LTG: Brett Munoz will improve quality of life by maintaining ongoing abstinence from all mood-altering substances  STG: Brett Munoz will identify 3 personal recovery goals   ProgressTowards Goals: Progressing  Interventions: Supportive and Other: substance abuse counseling  Summary: Brett Munoz is a 32 y.o. male who presents with depression, anxiety, and severe alcohol use disorder.  He had not been seen in session since 4/22 when he left abruptly.  It was noted at that time by CSW that he was not vested in treatment and MI would be useful if he set future appointments to determine whether to continue therapy and what to work on.  Today he entered the room with a list of things to talk about, first of which was that he has been without alcohol for 30 days.  He described the withdrawals that he experienced when he tried to wean himself off alcohol and how he eventually checked himself into [facility] for detox.  While he has not used alcohol since that time, he continues to use marijuana and kratum so does not consider himself to be sober.  We discussed that he is practicing harm reduction.  He was reminded that in the past he has tried to drink in moderation several times, was asked what he thinks about that at this point in his life.  He stated that he does not think he can drink alcohol at all, although he is not yet prepared to say he will not drink it ever again.  Using Alcoholics Anonymous terminology he stated that he just knows he is not  drinking today and probably will not drink tomorrow.  He can verbalize many AA concepts but is not willing to continue in that program or to commit to it.  He did go to [employer] for the first 2 weeks after detox, but states he cannot imagine working all the steps.  He has had a variety of sponsors that have not worked out.  At one point he became irritated when he pointed out "we're talking about the 12 step programs again, why do we have to always go there?"  CSW apologized, stated it is what is known to therapist.  CSW reminded him he also had been bringing up the topic of AA meetings he has attended, asking if he knows what makes him angry about it.  He responded that he feels he has been "pretty clear I'm not interested."  CSW also asked if he wants to make a note that this is a subject he does not want to broach again, but he declined, stating he does not want to turn his back on advice.  Patient stated that the convention he went to where he had known he would use drugs did indeed turn into that type of event for him, being sold and traded various drugs while he was there.  He saw old friends and enjoyed that, but had to call out from work for the first 2 weeks he was back in town to recuperate and go through  detox.  He stated he is not able to recover as quickly as he once could.  He also said "I need to not drink to reach my full potential."  One positive development is that he is starting to have feelings of attraction toward men again, has had several short-term relationships and is still in touch with those people.  He talks about elements of each in which he questions himself, processed those aloud.  One of these relationships is now just by phone with a 30yo male from [place] and one is a local relationship with a transgender male in [place].  Suicidal/Homicidal: No   Therapist Response: Patient is progressing AEB engaging in scheduled therapy session.  He presented oriented x5 and stated he was  feeling "better."  CSW reviewed some of the overall topics of past sessions with patient who reported that he does often feel he has to "perform" when he is with somebody, that he gets tired of being around others quite easily and needs time to recuperate.  CSW normalized this.  Patient stated he is still having cravings for alcohol at times, and he wondered what to do if that gets bad, so we reviewed a number of 12-step as well as other coping skills he could use like calling someone, journaling, and such.  He did say he feels good physically but also feels good about where he is right now, with a clean apartment, cooking some meals, and such.  When it was appropriate, CSW brought up the idea of cognitive distortions and how we all use them including himself.  Patient stated he wants to schedule more therapy sessions for the future, as no more are scheduled at this point.  He stated he will see CSW "in a month."  Throughout the session, CSW gave patient the opportunity to explore thoughts and feelings associated with current life situations and past/present external stressors.   CSW encouraged patient's expression of feelings and validated patient's thoughts using empathy, active listening, open body language, and unconditional positive regard.      Recommendations:  Return to therapy if he schedules another appointment and talk about whether this is the right treatment for him right now  Plan: Return again as needed, desired  Diagnosis:  Encounter Diagnoses  Name Primary?   Alcohol use disorder, severe, in early remission (HCC) Yes   Substance abuse, binge pattern (HCC)    Anxiety disorder, unspecified type    Depression, unspecified depression type        Collaboration of Care:  None needed at this time - has not followed through with recommendations or appointments  Patient/Guardian was advised Release of Information must be obtained prior to any record release in order to collaborate their  care with an outside provider. Patient/Guardian was advised if they have not already done so to contact the registration department to sign all necessary forms in order for [place] to release information regarding their care.   Consent: Patient/Guardian gives verbal consent for treatment and assignment of benefits for services provided during this visit. Patient/Guardian expressed understanding and agreed to proceed.    Lynnell Chad, LCSW 05/07/2023

## 2023-06-01 ENCOUNTER — Other Ambulatory Visit: Payer: Self-pay | Admitting: Internal Medicine

## 2023-06-04 ENCOUNTER — Other Ambulatory Visit (HOSPITAL_BASED_OUTPATIENT_CLINIC_OR_DEPARTMENT_OTHER): Payer: Self-pay

## 2023-06-04 MED ORDER — METOPROLOL SUCCINATE ER 50 MG PO TB24
50.0000 mg | ORAL_TABLET | Freq: Every day | ORAL | 1 refills | Status: DC
  Filled 2023-06-04: qty 30, 30d supply, fill #0
  Filled 2023-07-04: qty 30, 30d supply, fill #1
  Filled 2023-07-12: qty 30, 30d supply, fill #0

## 2023-07-12 ENCOUNTER — Other Ambulatory Visit (HOSPITAL_COMMUNITY): Payer: Self-pay

## 2023-07-13 ENCOUNTER — Other Ambulatory Visit (HOSPITAL_COMMUNITY): Payer: Self-pay

## 2023-07-19 ENCOUNTER — Telehealth: Payer: Self-pay | Admitting: Family Medicine

## 2023-07-19 DIAGNOSIS — I10 Essential (primary) hypertension: Secondary | ICD-10-CM

## 2023-07-19 NOTE — Telephone Encounter (Signed)
Prescription Request  07/19/2023  LOV: 11/02/2022  What is the name of the medication or equipment? metoprolol succinate (TOPROL-XL) 50 MG 24 hr tablet   Have you contacted your pharmacy to request a refill? No   Which pharmacy would you like this sent to?  He need it sent through the mail order [facility] but he couldn't give me no address.  [phone]  Patient notified that their request is being sent to the clinical staff for review and that they should receive a response within 2 business days.   Please advise at Mobile [phone] (mobile)

## 2023-07-20 MED ORDER — METOPROLOL SUCCINATE ER 50 MG PO TB24
50.0000 mg | ORAL_TABLET | Freq: Every day | ORAL | 1 refills | Status: DC
Start: 2023-07-20 — End: 2023-12-06

## 2023-07-20 NOTE — Telephone Encounter (Signed)
Rx sent 

## 2023-12-06 ENCOUNTER — Other Ambulatory Visit: Payer: Self-pay | Admitting: Family Medicine

## 2023-12-06 DIAGNOSIS — I10 Essential (primary) hypertension: Secondary | ICD-10-CM

## 2024-02-04 ENCOUNTER — Ambulatory Visit: Admitting: Internal Medicine

## 2024-02-04 ENCOUNTER — Encounter: Payer: Self-pay | Admitting: Internal Medicine

## 2024-02-04 ENCOUNTER — Other Ambulatory Visit (HOSPITAL_BASED_OUTPATIENT_CLINIC_OR_DEPARTMENT_OTHER): Payer: Self-pay

## 2024-02-04 VITALS — BP 120/82 | HR 70 | Temp 98.1°F | Ht 70.0 in | Wt 234.4 lb

## 2024-02-04 DIAGNOSIS — I10 Essential (primary) hypertension: Secondary | ICD-10-CM | POA: Diagnosis not present

## 2024-02-04 DIAGNOSIS — J029 Acute pharyngitis, unspecified: Secondary | ICD-10-CM | POA: Diagnosis not present

## 2024-02-04 DIAGNOSIS — H6123 Impacted cerumen, bilateral: Secondary | ICD-10-CM

## 2024-02-04 MED ORDER — AZITHROMYCIN 250 MG PO TABS
ORAL_TABLET | ORAL | 0 refills | Status: DC
  Filled 2024-02-04: qty 6, 5d supply, fill #0

## 2024-02-04 MED ORDER — METOPROLOL SUCCINATE ER 50 MG PO TB24
50.0000 mg | ORAL_TABLET | Freq: Every day | ORAL | 3 refills | Status: AC
Start: 2024-02-04 — End: ?

## 2024-02-04 NOTE — Progress Notes (Signed)
 Subjective:  Patient ID: Brett Munoz, male    DOB: October 01, 1991  Age: 33 y.o. MRN: [id number]  CC: Medication Management (Medication refill)   HPI Timonthy Hovater Hemsley presents for HTN f/u C/o ST x 3 weeks - much better now  Outpatient Medications Prior to Visit  Medication Sig Dispense Refill   naproxen sodium (ALEVE) 220 MG tablet Take 440 mg by mouth 2 (two) times daily as needed (tendonitis pain).     metoprolol succinate (TOPROL-XL) 50 MG 24 hr tablet Take 1 tablet (50 mg total) by mouth daily. Needs office visit for further refills. 90 tablet 0   cloNIDine (CATAPRES) 0.1 MG tablet Take 1 tablet (0.1 mg total) by mouth daily. 30 tablet 0   diphenhydrAMINE (BENADRYL) 25 mg capsule Take 25-50 mg by mouth at bedtime as needed for sleep. (Patient not taking: Reported on 02/04/2024)     ondansetron (ZOFRAN-ODT) 4 MG disintegrating tablet Take 1 tablet (4 mg total) by mouth every 6 (six) hours as needed for nausea or vomiting. (Patient not taking: Reported on 02/04/2024) 20 tablet 0   traZODone (DESYREL) 50 MG tablet Take 1 tablet (50 mg total) by mouth at bedtime as needed for sleep. (Patient not taking: Reported on 02/04/2024) 30 tablet 0   No facility-administered medications prior to visit.    ROS: Review of Systems  Constitutional:  Negative for appetite change, fatigue and unexpected weight change.  HENT:  Positive for sore throat. Negative for congestion, nosebleeds, postnasal drip, rhinorrhea, sneezing and trouble swallowing.   Eyes:  Negative for itching and visual disturbance.  Respiratory:  Negative for cough.   Cardiovascular:  Negative for chest pain, palpitations and leg swelling.  Gastrointestinal:  Negative for abdominal distention, blood in stool, diarrhea and nausea.  Genitourinary:  Negative for frequency and hematuria.  Musculoskeletal:  Negative for back pain, gait problem, joint swelling and neck pain.  Skin:  Negative for rash.  Neurological:  Negative for  dizziness, tremors, speech difficulty and weakness.  Psychiatric/Behavioral:  Negative for agitation, dysphoric mood and sleep disturbance. The patient is not nervous/anxious.     Objective:  BP 120/82   Pulse 70   Temp 98.1 F (36.7 C)   Ht 5\' 10"  (1.778 m)   Wt 234 lb 6.4 oz (106.3 kg)   SpO2 98%   BMI 33.63 kg/m   BP Readings from Last 3 Encounters:  02/04/24 120/82  11/02/22 116/78  10/09/22 (!) 150/100    Wt Readings from Last 3 Encounters:  02/04/24 234 lb 6.4 oz (106.3 kg)  11/02/22 266 lb (120.7 kg)  10/09/22 266 lb (120.7 kg)    Physical Exam Constitutional:      General: He is not in acute distress.    Appearance: He is well-developed. He is not toxic-appearing.     Comments: NAD  HENT:     Right Ear: There is impacted cerumen.     Left Ear: There is impacted cerumen.     Mouth/Throat:     Pharynx: Posterior oropharyngeal erythema present. No oropharyngeal exudate.  Eyes:     Conjunctiva/sclera: Conjunctivae normal.     Pupils: Pupils are equal, round, and reactive to light.  Neck:     Thyroid: No thyromegaly.     Vascular: No JVD.  Cardiovascular:     Rate and Rhythm: Normal rate and regular rhythm.     Heart sounds: Normal heart sounds. No murmur heard.    No friction rub. No gallop.  Pulmonary:  Effort: Pulmonary effort is normal. No respiratory distress.     Breath sounds: Normal breath sounds. No wheezing or rales.  Chest:     Chest wall: No tenderness.  Abdominal:     General: Bowel sounds are normal. There is no distension.     Palpations: Abdomen is soft. There is no mass.     Tenderness: There is no abdominal tenderness. There is no guarding or rebound.  Musculoskeletal:        General: No tenderness. Normal range of motion.     Cervical back: Normal range of motion.  Lymphadenopathy:     Cervical: No cervical adenopathy.  Skin:    General: Skin is warm and dry.     Findings: No rash.  Neurological:     Mental Status: He is alert  and oriented to person, place, and time.     Cranial Nerves: No cranial nerve deficit.     Motor: No abnormal muscle tone.     Coordination: Coordination normal.     Gait: Gait normal.     Deep Tendon Reflexes: Reflexes are normal and symmetric.  Psychiatric:        Behavior: Behavior normal.        Thought Content: Thought content normal.        Judgment: Judgment normal.   Wax R>L  Eryth throat, enlarged tonsils, enlarged tonsillar LNs  Lab Results  Component Value Date   WBC 9.0 04/07/2023   HGB 15.5 04/07/2023   HCT 47.2 04/07/2023   PLT 245 04/07/2023   GLUCOSE 104 (H) 04/07/2023   CHOL 234 (H) 04/07/2023   TRIG 547 (H) 04/07/2023   HDL 44 04/07/2023   LDLDIRECT 125 (H) 04/07/2023   LDLCALC UNABLE TO CALCULATE IF TRIGLYCERIDE OVER 400 mg/dL 05/08/9484   ALT 99 (H) 04/07/2023   AST 68 (H) 04/07/2023   NA 137 04/07/2023   K 4.5 04/07/2023   CL 96 (L) 04/07/2023   CREATININE 1.19 04/07/2023   BUN 5 (L) 04/07/2023   CO2 29 04/07/2023   TSH 1.89 11/02/2022    No results found.  Assessment & Plan:   Problem List Items Addressed This Visit     SORE THROAT - Primary   New - much better Rx Zpac If not well - RTC to see Vickie/ get tested to r/o other causes       Excessive ear wax   Irrigate when well R>>L      Hypertension   Cont on Toprol XL 50 mg qd      Relevant Medications   metoprolol succinate (TOPROL-XL) 50 MG 24 hr tablet      Meds ordered this encounter  Medications   metoprolol succinate (TOPROL-XL) 50 MG 24 hr tablet    Sig: Take 1 tablet (50 mg total) by mouth daily. Needs office visit for further refills.    Dispense:  90 tablet    Refill:  3    Please send a replace/new response with 90-Day Supply if appropriate to maximize member benefit. Requesting 1 year supply.   azithromycin (ZITHROMAX Z-PAK) 250 MG tablet    Sig: Take as directed    Dispense:  6 tablet    Refill:  0      Follow-up: Return in about 2 weeks (around  02/18/2024) for f/u with PCP.  Sonda Primes, MD

## 2024-02-04 NOTE — Assessment & Plan Note (Addendum)
 Irrigate when well R>>L

## 2024-02-04 NOTE — Assessment & Plan Note (Signed)
 Cont on Toprol XL 50 mg qd

## 2024-02-04 NOTE — Assessment & Plan Note (Addendum)
 New - much better Rx Zpac If not well - RTC to see Vickie/ get tested to r/o other causes

## 2024-02-13 ENCOUNTER — Encounter: Payer: Self-pay | Admitting: Family Medicine

## 2024-02-13 ENCOUNTER — Other Ambulatory Visit (HOSPITAL_BASED_OUTPATIENT_CLINIC_OR_DEPARTMENT_OTHER): Payer: Self-pay

## 2024-02-13 ENCOUNTER — Ambulatory Visit (INDEPENDENT_AMBULATORY_CARE_PROVIDER_SITE_OTHER): Admitting: Family Medicine

## 2024-02-13 ENCOUNTER — Telehealth: Payer: Self-pay

## 2024-02-13 VITALS — BP 120/70 | HR 95 | Temp 97.8°F | Ht 70.0 in | Wt 233.0 lb

## 2024-02-13 DIAGNOSIS — I10 Essential (primary) hypertension: Secondary | ICD-10-CM | POA: Diagnosis not present

## 2024-02-13 DIAGNOSIS — E781 Pure hyperglyceridemia: Secondary | ICD-10-CM | POA: Diagnosis not present

## 2024-02-13 DIAGNOSIS — J020 Streptococcal pharyngitis: Secondary | ICD-10-CM | POA: Diagnosis not present

## 2024-02-13 DIAGNOSIS — R748 Abnormal levels of other serum enzymes: Secondary | ICD-10-CM

## 2024-02-13 DIAGNOSIS — E66811 Obesity, class 1: Secondary | ICD-10-CM

## 2024-02-13 DIAGNOSIS — Z7251 High risk heterosexual behavior: Secondary | ICD-10-CM

## 2024-02-13 DIAGNOSIS — Z209 Contact with and (suspected) exposure to unspecified communicable disease: Secondary | ICD-10-CM

## 2024-02-13 LAB — CBC WITH DIFFERENTIAL/PLATELET
Basophils Absolute: 0.1 10*3/uL (ref 0.0–0.1)
Basophils Relative: 0.4 % (ref 0.0–3.0)
Eosinophils Absolute: 0.1 10*3/uL (ref 0.0–0.7)
Eosinophils Relative: 0.3 % (ref 0.0–5.0)
HCT: 40 % (ref 39.0–52.0)
Hemoglobin: 13.1 g/dL (ref 13.0–17.0)
Lymphocytes Relative: 4.7 % — ABNORMAL LOW (ref 12.0–46.0)
Lymphs Abs: 1 10*3/uL (ref 0.7–4.0)
MCHC: 32.7 g/dL (ref 30.0–36.0)
MCV: 82 fl (ref 78.0–100.0)
Monocytes Absolute: 1.6 10*3/uL — ABNORMAL HIGH (ref 0.1–1.0)
Monocytes Relative: 7.6 % (ref 3.0–12.0)
Neutro Abs: 18.5 10*3/uL — ABNORMAL HIGH (ref 1.4–7.7)
Neutrophils Relative %: 87 % — ABNORMAL HIGH (ref 43.0–77.0)
Platelets: 347 10*3/uL (ref 150.0–400.0)
RBC: 4.88 Mil/uL (ref 4.22–5.81)
RDW: 14.7 % (ref 11.5–15.5)
WBC: 21.3 10*3/uL (ref 4.0–10.5)

## 2024-02-13 LAB — LIPID PANEL
Cholesterol: 143 mg/dL (ref 0–200)
HDL: 39.3 mg/dL (ref >39.00)
LDL Cholesterol: 88 mg/dL (ref 0–99)
NonHDL: 103.61
Total CHOL/HDL Ratio: 4
Triglycerides: 79 mg/dL (ref 0.0–149.0)
VLDL: 15.8 mg/dL (ref 0.0–40.0)

## 2024-02-13 LAB — BASIC METABOLIC PANEL WITH GFR
BUN: 8 mg/dL (ref 6–23)
CO2: 31 meq/L (ref 19–32)
Calcium: 9.1 mg/dL (ref 8.4–10.5)
Chloride: 100 meq/L (ref 96–112)
Creatinine, Ser: 0.91 mg/dL (ref 0.40–1.50)
GFR: 113.11 mL/min (ref >60.00)
Glucose, Bld: 108 mg/dL — ABNORMAL HIGH (ref 70–99)
Potassium: 3.7 meq/L (ref 3.5–5.1)
Sodium: 137 meq/L (ref 135–145)

## 2024-02-13 LAB — HEPATIC FUNCTION PANEL
ALT: 18 U/L (ref 0–53)
AST: 16 U/L (ref 0–37)
Albumin: 4.1 g/dL (ref 3.5–5.2)
Alkaline Phosphatase: 83 U/L (ref 39–117)
Bilirubin, Direct: 0 mg/dL (ref 0.0–0.3)
Total Bilirubin: 0.4 mg/dL (ref 0.2–1.2)
Total Protein: 7.2 g/dL (ref 6.0–8.3)

## 2024-02-13 LAB — POCT RAPID STREP A (OFFICE): Rapid Strep A Screen: POSITIVE — AB

## 2024-02-13 MED ORDER — AMOXICILLIN-POT CLAVULANATE 875-125 MG PO TABS
1.0000 | ORAL_TABLET | Freq: Two times a day (BID) | ORAL | 0 refills | Status: DC
Start: 2024-02-13 — End: 2024-09-03
  Filled 2024-02-13: qty 20, 10d supply, fill #0

## 2024-02-13 MED ORDER — PREDNISONE 20 MG PO TABS
40.0000 mg | ORAL_TABLET | Freq: Every day | ORAL | 0 refills | Status: DC
  Filled 2024-02-13: qty 10, 5d supply, fill #0

## 2024-02-13 NOTE — Patient Instructions (Signed)
 Take the antibiotic as prescribed with food and plenty of water.  Take the oral steroids with plenty of water.  This is to help with inflammation and pain.  Let me know if you are getting worse.  I will be in touch with your lab and urine results.  I recommend follow-up in 2 to 3 weeks to retest for strep

## 2024-02-13 NOTE — Telephone Encounter (Signed)
 CRITICAL VALUE STICKER  CRITICAL VALUE: White Count 21.3  RECEIVER (on-site recipient of call):  DATE & TIME NOTIFIED: 02/13/2024 1536  MESSENGER (representative from lab): Clydie Braun  MD NOTIFIED: Suezanne Jacquet

## 2024-02-13 NOTE — Progress Notes (Signed)
 Subjective:  Brett Munoz is a 33 y.o. male who presents for ST intermittent x 4 wks.  Feverish and chills last night. Worsening ST with swelling and white patches x 3 days.   Sexual encounter a few days and had oral sex with male partner.   He was seen 2 wks ago and prescribed Zpak.   Sober for 10 months.   Triglycerides and LFTs last year   Losing weight    ROS as in subjective.   Objective: Vitals:   02/13/24 1302  BP: 120/70  Pulse: 95  Temp: 97.8 F (36.6 C)  SpO2: 97%    General appearance: Alert, WD/WN, no distress                             Skin: warm, no rash                           Head: no sinus tenderness                            Eyes: conjunctiva normal, corneas clear, PERRLA                            Ears: pearly TMs, external ear canals normal                          Nose: septum midline, no discharge              Mouth/throat: MMM, tongue normal, mod pharyngeal erythema with edema and exudate                            Neck: supple, no adenopathy, no thyromegaly, nontender                          Heart: RRR                         Lungs: CTA bilaterally, no wheezes, rales, or rhonchi      Assessment: Acute streptococcal pharyngitis - Plan: amoxicillin-clavulanate (AUGMENTIN) 875-125 MG tablet, POCT rapid strep A  Sexual behavior with high risk of exposure to communicable disease - Plan: Hepatitis C antibody, RPR, HIV Antibody (routine testing w rflx), Hepatitis B surface antigen, GC/Chlamydia Probe Amp, GC/Chlamydia Probe Amp, GC/Chlamydia Probe Amp, CANCELED: GC/Chlamydia Probe Amp  Primary hypertension - Plan: CBC with Differential/Platelet, Basic metabolic panel with GFR  Abnormal liver enzymes - Plan: CBC with Differential/Platelet, Basic metabolic panel with GFR, Hepatic function panel  Hypertriglyceridemia - Plan: Lipid panel  Obesity (BMI 30.0-34.9) - Plan: CBC with Differential/Platelet, Basic metabolic panel with GFR, Hepatic  function panel, Lipid panel   Plan: Rapid strep test positive - Augmentin prescribed. Comp course of azithromycin 2 weeks ago  GC/CT throat swab obtained and sent as well as other STI testing.  Mod swollen tonsils - oral prednisone prescribed.  Salt water gargles.  Discussed contagious nature of strep.  Check labs for history of elevated LFTs and hypertriglyceridemia.  He has been sober for 10 months and has been taking better care of himself.  He has lost weight with a healthy diet.  Follow-up pending results.  We discussed a follow-up strep  test to ensure resolution in 2 to 3 weeks.     Suggested symptomatic OTC remedies. Tylenol or Ibuprofen OTC for fever and malaise.  Call/return in 2-3 days if symptoms aren't resolving.

## 2024-02-13 NOTE — Telephone Encounter (Signed)
 Called pt per Vickie's request to attempt to get him in for visit Friday for recheck of WBC, pt states he will be out of town and booked f/u for Monday

## 2024-02-14 LAB — RPR: RPR Ser Ql: NONREACTIVE

## 2024-02-14 LAB — HEPATITIS B SURFACE ANTIGEN: Hepatitis B Surface Ag: NONREACTIVE

## 2024-02-14 LAB — HIV ANTIBODY (ROUTINE TESTING W REFLEX): HIV 1&2 Ab, 4th Generation: NONREACTIVE

## 2024-02-14 LAB — HEPATITIS C ANTIBODY: Hepatitis C Ab: NONREACTIVE

## 2024-02-16 LAB — GC/CHLAMYDIA PROBE AMP
Chlamydia trachomatis, NAA: NEGATIVE
Chlamydia trachomatis, NAA: NEGATIVE
Neisseria Gonorrhoeae by PCR: NEGATIVE
Neisseria Gonorrhoeae by PCR: NEGATIVE

## 2024-02-18 ENCOUNTER — Encounter: Payer: Self-pay | Admitting: Family Medicine

## 2024-02-18 ENCOUNTER — Ambulatory Visit: Admitting: Family Medicine

## 2024-02-18 VITALS — BP 132/86 | HR 85 | Temp 97.6°F | Ht 70.0 in | Wt 237.0 lb

## 2024-02-18 DIAGNOSIS — D72828 Other elevated white blood cell count: Secondary | ICD-10-CM | POA: Diagnosis not present

## 2024-02-18 DIAGNOSIS — J020 Streptococcal pharyngitis: Secondary | ICD-10-CM | POA: Diagnosis not present

## 2024-02-18 LAB — CBC WITH DIFFERENTIAL/PLATELET
Basophils Absolute: 0.1 10*3/uL (ref 0.0–0.1)
Basophils Relative: 1.1 % (ref 0.0–3.0)
Eosinophils Absolute: 0.1 10*3/uL (ref 0.0–0.7)
Eosinophils Relative: 1.1 % (ref 0.0–5.0)
HCT: 41.5 % (ref 39.0–52.0)
Hemoglobin: 13.6 g/dL (ref 13.0–17.0)
Lymphocytes Relative: 30.8 % (ref 12.0–46.0)
Lymphs Abs: 3.8 10*3/uL (ref 0.7–4.0)
MCHC: 32.8 g/dL (ref 30.0–36.0)
MCV: 81.9 fl (ref 78.0–100.0)
Monocytes Absolute: 1.3 10*3/uL — ABNORMAL HIGH (ref 0.1–1.0)
Monocytes Relative: 10.5 % (ref 3.0–12.0)
Neutro Abs: 6.9 10*3/uL (ref 1.4–7.7)
Neutrophils Relative %: 56.5 % (ref 43.0–77.0)
Platelets: 429 10*3/uL — ABNORMAL HIGH (ref 150.0–400.0)
RBC: 5.06 Mil/uL (ref 4.22–5.81)
RDW: 14.5 % (ref 11.5–15.5)
WBC: 12.2 10*3/uL — ABNORMAL HIGH (ref 4.0–10.5)

## 2024-02-18 LAB — COMPREHENSIVE METABOLIC PANEL WITH GFR
ALT: 22 U/L (ref 0–53)
AST: 11 U/L (ref 0–37)
Albumin: 4.1 g/dL (ref 3.5–5.2)
Alkaline Phosphatase: 68 U/L (ref 39–117)
BUN: 15 mg/dL (ref 6–23)
CO2: 30 meq/L (ref 19–32)
Calcium: 9.3 mg/dL (ref 8.4–10.5)
Chloride: 103 meq/L (ref 96–112)
Creatinine, Ser: 0.86 mg/dL (ref 0.40–1.50)
GFR: 116.2 mL/min (ref >60.00)
Glucose, Bld: 93 mg/dL (ref 70–99)
Potassium: 4.9 meq/L (ref 3.5–5.1)
Sodium: 138 meq/L (ref 135–145)
Total Bilirubin: 0.3 mg/dL (ref 0.2–1.2)
Total Protein: 7 g/dL (ref 6.0–8.3)

## 2024-02-18 NOTE — Progress Notes (Signed)
   Acute Office Visit  Subjective:     Patient ID: Brett Munoz, male    DOB: 1991-02-25, 33 y.o.   MRN: [id number]  Chief Complaint  Patient presents with   Follow-up    HPI Patient is in today for evaluation of WBC. WBC 21.3 at last visit 5 days ago. Was treated for strep at that visit.  Reports compliance with Augmentin and steroids.  Has completed steroid. Has 4 more days of Augmentin. Reports that he is feeling much better. Denies any fevers, upset stomach, rash, change in urinary output, other symptoms.   ROS Per HPI      Objective:    BP 132/86 (BP Location: Left Arm, Patient Position: Sitting)   Pulse 85   Temp 97.6 F (36.4 C) (Temporal)   Ht 5\' 10"  (1.778 m)   Wt 237 lb (107.5 kg)   SpO2 99%   BMI 34.01 kg/m    Physical Exam Vitals and nursing note reviewed.  Constitutional:      General: He is not in acute distress.    Appearance: Normal appearance. He is obese.  HENT:     Head: Normocephalic and atraumatic.     Right Ear: External ear normal.     Left Ear: External ear normal.     Nose: Nose normal. No congestion.     Mouth/Throat:     Mouth: Mucous membranes are moist.     Pharynx: Oropharynx is clear. No posterior oropharyngeal erythema.  Eyes:     Extraocular Movements: Extraocular movements intact.  Cardiovascular:     Rate and Rhythm: Normal rate and regular rhythm.     Pulses: Normal pulses.     Heart sounds: Normal heart sounds. No murmur heard. Pulmonary:     Effort: Pulmonary effort is normal. No respiratory distress.     Breath sounds: Normal breath sounds. No wheezing, rhonchi or rales.  Musculoskeletal:        General: Normal range of motion.     Cervical back: Normal range of motion.     Right lower leg: No edema.     Left lower leg: No edema.  Lymphadenopathy:     Cervical: No cervical adenopathy.  Skin:    General: Skin is warm and dry.  Neurological:     General: No focal deficit present.     Mental Status: He is  alert and oriented to person, place, and time.  Psychiatric:        Mood and Affect: Mood normal.        Behavior: Behavior normal.    No results found for any visits on 02/18/24.      Assessment & Plan:   Acute streptococcal pharyngitis -     CBC with Differential/Platelet -     Comprehensive metabolic panel with GFR  Other elevated white blood cell (WBC) count -     CBC with Differential/Platelet -     Comprehensive metabolic panel with GFR  Will check kidney function today Will recheck CBC today Symptoms have resolved  No orders of the defined types were placed in this encounter.   Return if symptoms worsen or fail to improve.  Sherald Barge, FNP

## 2024-02-18 NOTE — Patient Instructions (Signed)
 We are checking labs today, will be in contact with any results that require further attention  Continue current medication regimen.   Follow-up with me for new or worsening symptoms.

## 2024-09-03 ENCOUNTER — Ambulatory Visit: Payer: Self-pay

## 2024-09-03 ENCOUNTER — Encounter: Payer: Self-pay | Admitting: Family

## 2024-09-03 ENCOUNTER — Ambulatory Visit: Admitting: Family

## 2024-09-03 VITALS — BP 134/82 | HR 73 | Temp 97.0°F | Ht 70.0 in | Wt 225.8 lb

## 2024-09-03 DIAGNOSIS — J029 Acute pharyngitis, unspecified: Secondary | ICD-10-CM

## 2024-09-03 LAB — POCT RAPID STREP A (OFFICE): Rapid Strep A Screen: NEGATIVE

## 2024-09-03 MED ORDER — LIDOCAINE VISCOUS HCL 2 % MT SOLN
15.0000 mL | OROMUCOSAL | 0 refills | Status: AC | PRN
Start: 2024-09-03 — End: ?

## 2024-09-03 NOTE — Progress Notes (Signed)
 Patient ID: Brett Munoz, male    DOB: 1990/12/23, 33 y.o.   MRN: [id number]  Chief Complaint  Patient presents with   Sore Throat    Pt c/o sore throat, chills and body aches. Present for 2 days, Has tried ibuprofen  and tylenol , which did help slightly. HX of strep.   Discussed the use of AI scribe software for clinical note transcription with the patient, who gave verbal consent to proceed.  History of Present Illness Brett Munoz is a 33 year old male who presents with sore throat and fatigue.  He has experienced sore throat, fatigue, body aches, and chills for the past two to three days. Symptoms include episodes of sweating followed by chills. He has been using Tylenol  and ibuprofen  for symptom management, which may have masked a fever. Fatigue has led him to spend most of the past two days in bed.  He has a history of strep throat, with the last episode in the spring. He still has his tonsils. There is no runny nose or cough. He has not had the flu before but usually receives a flu shot.  He recently worked ten hours of overtime and attended a [employer], which he believes may have contributed to his current illness. He takes a multivitamin and is on PrEP for pre-exposure prophylaxis, and doxycycline for post-exposure prophylaxis available at home but not used.  Assessment & Plan Acute pharyngitis Acute pharyngitis with sore throat, aches, chills, and fatigue. Negative strep test suggests viral etiology. Differential includes viral versus bacterial infection. Possible mild fever masked by Tylenol . - Advise ibuprofen  600 mg three times a day after eating for inflammation, fever, body aches, and pain management. - Prescribe lidocaine 2% mouth rinse for symptomatic relief of throat pain. - Discontinue Tylenol  as it lacks anti-inflammatory properties. - Increase water intake to 2.5L daily. - Call Monday if sx are not improved.   Subjective:    Outpatient Medications Prior  to Visit  Medication Sig Dispense Refill   metoprolol  succinate (TOPROL -XL) 50 MG 24 hr tablet Take 1 tablet (50 mg total) by mouth daily. Needs office visit for further refills. 90 tablet 3   amoxicillin -clavulanate (AUGMENTIN ) 875-125 MG tablet Take 1 tablet by mouth 2 (two) times daily. 20 tablet 0   No facility-administered medications prior to visit.   Past Medical History:  Diagnosis Date   Internal hemorrhoids    No past surgical history on file. No Known Allergies    Objective:    Physical Exam Vitals and nursing note reviewed.  Constitutional:      General: He is not in acute distress.    Appearance: Normal appearance.  HENT:     Head: Normocephalic.     Right Ear: Tympanic membrane and ear canal normal.     Left Ear: Tympanic membrane and ear canal normal.     Nose:     Right Sinus: No maxillary sinus tenderness or frontal sinus tenderness.     Left Sinus: No maxillary sinus tenderness or frontal sinus tenderness.     Mouth/Throat:     Mouth: Mucous membranes are moist.     Pharynx: Posterior oropharyngeal erythema present. No pharyngeal swelling, oropharyngeal exudate or uvula swelling.     Tonsils: Tonsillar exudate present. No tonsillar abscesses. 2+ on the right. 2+ on the left.  Cardiovascular:     Rate and Rhythm: Normal rate and regular rhythm.  Pulmonary:     Effort: Pulmonary effort is normal.  Breath sounds: Normal breath sounds.  Musculoskeletal:        General: Normal range of motion.     Cervical back: Normal range of motion.  Lymphadenopathy:     Head:     Right side of head: No preauricular or posterior auricular adenopathy.     Left side of head: No preauricular or posterior auricular adenopathy.     Cervical: No cervical adenopathy.  Skin:    General: Skin is warm and dry.  Neurological:     Mental Status: He is alert and oriented to person, place, and time.  Psychiatric:        Mood and Affect: Mood normal.    BP 134/82 (BP Location:  Left Arm, Patient Position: Sitting, Cuff Size: Large)   Pulse 73   Temp (!) 97 F (36.1 C) (Temporal)   Ht 5' 10 (1.778 m)   Wt 225 lb 12.8 oz (102.4 kg)   SpO2 98%   BMI 32.40 kg/m  Wt Readings from Last 3 Encounters:  09/03/24 225 lb 12.8 oz (102.4 kg)  02/18/24 237 lb (107.5 kg)  02/13/24 233 lb (105.7 kg)       Lucius Krabbe, NP

## 2024-09-03 NOTE — Telephone Encounter (Signed)
 FYI Only or Action Required?: FYI only for provider.  Patient was last seen in primary care on 02/18/2024 by Alvia Corean CROME, FNP.  Called Nurse Triage reporting Sore Throat and Fatigue.  Symptoms began several days ago.  Interventions attempted: OTC medications: ibuprofen  and tylenlo.  Symptoms are: gradually worsening.  Triage Disposition: See Physician Within 24 Hours  Patient/caregiver understands and will follow disposition?: Yes  Reason for Triage: pt experiencing sore throat and fatigue for past 3 days asking for video appt for antibiotics and believes may be strep throat again  [phone] (M)   Reason for Disposition  [1] Pus on tonsils (back of throat) AND [2] fever AND [3] swollen neck lymph nodes (glands)  Answer Assessment - Initial Assessment Questions 1. ONSET: When did the throat start hurting? (Hours or days ago)      Three days ago 2. SEVERITY: How bad is the sore throat? (Scale 1-10; mild, moderate or severe)     4/10 3. STREP EXPOSURE: Has there been any exposure to strep within the past week? If Yes, ask: What type of contact occurred?      denies 4.  VIRAL SYMPTOMS: Are there any symptoms of a cold, such as a runny nose, cough, hoarse voice or red eyes?      denies 5. FEVER: Do you have a fever? If Yes, ask: What is your temperature, how was it measured, and when did it start?     Felt feverish/chills 6. PUS ON THE TONSILS: Is there pus on the tonsils in the back of your throat?     Observed white patches on tonsils 7. OTHER SYMPTOMS: Do you have any other symptoms? (e.g., difficulty breathing, headache, rash)     Head ache and body aches 8. PREGNANCY: Is there any chance you are pregnant? When was your last menstrual period?     N/a  Protocols used: Sore Throat-A-AH
